# Patient Record
Sex: Male | Born: 1955 | Race: White | Hispanic: No | Marital: Single | Smoking: Never smoker
Health system: Southern US, Community
[De-identification: ages and names within clinical notes are randomized; demographics above are authoritative.]

## PROBLEM LIST (undated history)

## (undated) DIAGNOSIS — I10 Essential (primary) hypertension: Secondary | ICD-10-CM

## (undated) DIAGNOSIS — M1990 Unspecified osteoarthritis, unspecified site: Secondary | ICD-10-CM

## (undated) DIAGNOSIS — K760 Fatty (change of) liver, not elsewhere classified: Secondary | ICD-10-CM

## (undated) DIAGNOSIS — N4340 Spermatocele of epididymis, unspecified: Secondary | ICD-10-CM

## (undated) DIAGNOSIS — R600 Localized edema: Secondary | ICD-10-CM

## (undated) DIAGNOSIS — N433 Hydrocele, unspecified: Secondary | ICD-10-CM

## (undated) HISTORY — PX: COLONOSCOPY: SHX174

## (undated) HISTORY — PX: TONSILLECTOMY: SUR1361

---

## 2000-10-20 ENCOUNTER — Encounter: Payer: Self-pay | Admitting: Family Medicine

## 2000-10-20 ENCOUNTER — Encounter: Admission: RE | Admit: 2000-10-20 | Discharge: 2000-10-20 | Payer: Self-pay | Admitting: Family Medicine

## 2003-08-09 ENCOUNTER — Ambulatory Visit (HOSPITAL_COMMUNITY): Admission: RE | Admit: 2003-08-09 | Discharge: 2003-08-09 | Payer: Self-pay | Admitting: Gastroenterology

## 2004-06-01 HISTORY — PX: INGUINAL HERNIA REPAIR: SUR1180

## 2010-04-03 ENCOUNTER — Encounter: Admission: RE | Admit: 2010-04-03 | Discharge: 2010-04-03 | Payer: Self-pay | Admitting: Internal Medicine

## 2010-06-21 ENCOUNTER — Encounter: Payer: Self-pay | Admitting: Internal Medicine

## 2012-11-22 ENCOUNTER — Ambulatory Visit
Admission: RE | Admit: 2012-11-22 | Discharge: 2012-11-22 | Disposition: A | Discharge status: Home / Self Care | Payer: BC Managed Care – PPO | Source: Ambulatory Visit | Attending: Internal Medicine | Admitting: Internal Medicine

## 2012-11-22 ENCOUNTER — Other Ambulatory Visit: Payer: Self-pay | Admitting: Internal Medicine

## 2012-11-22 DIAGNOSIS — N5089 Other specified disorders of the male genital organs: Secondary | ICD-10-CM

## 2012-11-25 ENCOUNTER — Other Ambulatory Visit (HOSPITAL_COMMUNITY): Payer: Self-pay | Admitting: Orthopedic Surgery

## 2012-12-15 ENCOUNTER — Encounter (HOSPITAL_COMMUNITY): Payer: Self-pay

## 2012-12-20 ENCOUNTER — Encounter (HOSPITAL_COMMUNITY): Payer: Self-pay

## 2012-12-20 ENCOUNTER — Ambulatory Visit (HOSPITAL_COMMUNITY)
Admission: RE | Admit: 2012-12-20 | Discharge: 2012-12-20 | Disposition: A | Discharge status: Home / Self Care | Payer: BC Managed Care – PPO | Source: Ambulatory Visit | Attending: Orthopedic Surgery | Admitting: Orthopedic Surgery

## 2012-12-20 ENCOUNTER — Encounter (HOSPITAL_COMMUNITY)
Admission: RE | Admit: 2012-12-20 | Discharge: 2012-12-20 | Disposition: A | Discharge status: Home / Self Care | Payer: BC Managed Care – PPO | Source: Ambulatory Visit | Attending: Orthopedic Surgery | Admitting: Orthopedic Surgery

## 2012-12-20 DIAGNOSIS — Z01812 Encounter for preprocedural laboratory examination: Secondary | ICD-10-CM | POA: Insufficient documentation

## 2012-12-20 DIAGNOSIS — I10 Essential (primary) hypertension: Secondary | ICD-10-CM | POA: Insufficient documentation

## 2012-12-20 DIAGNOSIS — Z01818 Encounter for other preprocedural examination: Secondary | ICD-10-CM | POA: Insufficient documentation

## 2012-12-20 DIAGNOSIS — Z0183 Encounter for blood typing: Secondary | ICD-10-CM | POA: Insufficient documentation

## 2012-12-20 HISTORY — DX: Essential (primary) hypertension: I10

## 2012-12-20 LAB — COMPREHENSIVE METABOLIC PANEL
Albumin: 4.3 g/dL (ref 3.5–5.2)
Alkaline Phosphatase: 77 U/L (ref 39–117)
BUN: 16 mg/dL (ref 6–23)
Calcium: 10 mg/dL (ref 8.4–10.5)
Sodium: 137 mEq/L (ref 135–145)
Total Protein: 7.5 g/dL (ref 6.0–8.3)

## 2012-12-20 LAB — CBC
Hemoglobin: 15.1 g/dL (ref 13.0–17.0)
MCH: 34.2 pg — ABNORMAL HIGH (ref 26.0–34.0)
MCV: 95 fL (ref 78.0–100.0)
Platelets: 150 10*3/uL (ref 150–400)
RBC: 4.41 MIL/uL (ref 4.22–5.81)
WBC: 6.8 10*3/uL (ref 4.0–10.5)

## 2012-12-20 LAB — PROTIME-INR
INR: 0.96 (ref 0.00–1.49)
Prothrombin Time: 12.6 seconds (ref 11.6–15.2)

## 2012-12-20 LAB — SURGICAL PCR SCREEN: MRSA, PCR: NEGATIVE

## 2012-12-20 LAB — TYPE AND SCREEN
ABO/RH(D): O NEG
Antibody Screen: NEGATIVE

## 2012-12-20 NOTE — Pre-Procedure Instructions (Signed)
PLUMER MITTELSTAEDT  12/20/2012   Your procedure is scheduled on:  December 28, 2012  Report to [facility] at 6:30 AM.  Call this number if you have problems the morning of surgery: [phone]   Remember:   Do not eat food or drink liquids after midnight.   Take these medicines the morning of surgery with A SIP OF WATER: amLODipine (NORVASC) 10 MG tablet, carvedilol (COREG) 6.25 MG tablet   Do not wear jewelry  Do not wear lotions, powders, or colognes. You may wear deodorant.  Men may shave face and neck.  Do not bring valuables to the hospital.  [facility] is not responsible for any belongings or valuables.  Contacts, dentures or bridgework may not be worn into surgery.  Leave suitcase in the car. After surgery it may be brought to your room.  For patients admitted to the hospital, checkout time is 11:00 AM the day of discharge.   Special Instructions: Shower using CHG 2 nights before surgery and the night before surgery.  If you shower the day of surgery use CHG.  Use special wash - you have one bottle of CHG for all showers.  You should use approximately 1/3 of the bottle for each shower.   Please read over the following fact sheets that you were given: Pain Booklet, Coughing and Deep Breathing, Blood Transfusion Information and Surgical Site Infection Prevention

## 2012-12-21 NOTE — Progress Notes (Signed)
Anesthesia Chart Review:  Patient is a 57 year old male scheduled for left THA on 12/28/12 by Dr. Lajoyce Corners.  History includes HTN, non-smoker, hernia repair.  EKG on 12/20/12 showed SR with sinus arrythmia.  Motion in lead V2.  No CV symptoms were documented at his PAT visit.  Preoperative CXR and labs noted.  He will be evaluated by his assigned anesthesiologist on the day of surgery, but would anticipate that he could proceed as planned.  [facility]/Anesthesiology Phone [phone] 12/21/2012 2:17 PM

## 2012-12-27 MED ORDER — CEFAZOLIN SODIUM-DEXTROSE 2-3 GM-% IV SOLR
2.0000 g | INTRAVENOUS | Status: AC
  Administered 2012-12-28: 2 g via INTRAVENOUS
  Filled 2012-12-27: qty 50

## 2012-12-28 ENCOUNTER — Encounter (HOSPITAL_COMMUNITY): Payer: Self-pay | Admitting: Vascular Surgery

## 2012-12-28 ENCOUNTER — Ambulatory Visit (HOSPITAL_COMMUNITY): Payer: BC Managed Care – PPO | Admitting: Anesthesiology

## 2012-12-28 ENCOUNTER — Encounter (HOSPITAL_COMMUNITY)
Admission: RE | Disposition: A | Discharge status: Home Health | Payer: Self-pay | Source: Ambulatory Visit | Attending: Orthopedic Surgery

## 2012-12-28 ENCOUNTER — Inpatient Hospital Stay (HOSPITAL_COMMUNITY)
Admission: RE | Admit: 2012-12-28 | Discharge: 2012-12-30 | DRG: 818 | Disposition: A | Discharge status: Home Health | Payer: BC Managed Care – PPO | Source: Ambulatory Visit | Attending: Orthopedic Surgery | Admitting: Orthopedic Surgery

## 2012-12-28 ENCOUNTER — Inpatient Hospital Stay (HOSPITAL_COMMUNITY): Payer: BC Managed Care – PPO

## 2012-12-28 ENCOUNTER — Encounter (HOSPITAL_COMMUNITY): Payer: Self-pay | Admitting: *Deleted

## 2012-12-28 DIAGNOSIS — Z79899 Other long term (current) drug therapy: Secondary | ICD-10-CM

## 2012-12-28 DIAGNOSIS — M87059 Idiopathic aseptic necrosis of unspecified femur: Principal | ICD-10-CM | POA: Diagnosis present

## 2012-12-28 DIAGNOSIS — I10 Essential (primary) hypertension: Secondary | ICD-10-CM | POA: Diagnosis present

## 2012-12-28 DIAGNOSIS — M87052 Idiopathic aseptic necrosis of left femur: Secondary | ICD-10-CM

## 2012-12-28 HISTORY — PX: TOTAL HIP ARTHROPLASTY: SHX124

## 2012-12-28 HISTORY — DX: Unspecified osteoarthritis, unspecified site: M19.90

## 2012-12-28 SURGERY — ARTHROPLASTY, HIP, TOTAL,POSTERIOR APPROACH
Anesthesia: General | Site: Hip | Laterality: Left | Wound class: Clean

## 2012-12-28 MED ORDER — PROMETHAZINE HCL 25 MG/ML IJ SOLN: 6.2500 mg | INTRAMUSCULAR | Status: DC | PRN

## 2012-12-28 MED ORDER — OXYCODONE HCL 5 MG PO TABS
ORAL_TABLET | ORAL | Status: AC
  Filled 2012-12-28: qty 1

## 2012-12-28 MED ORDER — METHOCARBAMOL 100 MG/ML IJ SOLN
500.0000 mg | Freq: Four times a day (QID) | INTRAVENOUS | Status: DC | PRN
  Filled 2012-12-28: qty 5

## 2012-12-28 MED ORDER — ACETAMINOPHEN 325 MG PO TABS: 650.0000 mg | ORAL_TABLET | Freq: Four times a day (QID) | ORAL | Status: DC | PRN

## 2012-12-28 MED ORDER — OXYCODONE HCL 5 MG PO TABS
5.0000 mg | ORAL_TABLET | Freq: Once | ORAL | Status: AC | PRN
  Administered 2012-12-28: 5 mg via ORAL

## 2012-12-28 MED ORDER — METHOCARBAMOL 500 MG PO TABS
500.0000 mg | ORAL_TABLET | Freq: Four times a day (QID) | ORAL | Status: DC | PRN
  Administered 2012-12-29: 500 mg via ORAL
  Filled 2012-12-28 (×2): qty 1

## 2012-12-28 MED ORDER — LISINOPRIL 20 MG PO TABS
20.0000 mg | ORAL_TABLET | Freq: Every day | ORAL | Status: DC
  Administered 2012-12-28 – 2012-12-30 (×3): 20 mg via ORAL
  Filled 2012-12-28 (×3): qty 1

## 2012-12-28 MED ORDER — PROPOFOL 10 MG/ML IV BOLUS
INTRAVENOUS | Status: DC | PRN
  Administered 2012-12-28: 130 mg via INTRAVENOUS

## 2012-12-28 MED ORDER — METOCLOPRAMIDE HCL 5 MG/ML IJ SOLN
5.0000 mg | Freq: Three times a day (TID) | INTRAMUSCULAR | Status: DC | PRN
  Filled 2012-12-28: qty 2

## 2012-12-28 MED ORDER — OXYCODONE HCL 5 MG/5ML PO SOLN: 5.0000 mg | Freq: Once | ORAL | Status: AC | PRN

## 2012-12-28 MED ORDER — GLYCOPYRROLATE 0.2 MG/ML IJ SOLN
INTRAMUSCULAR | Status: DC | PRN
  Administered 2012-12-28: 0.4 mg via INTRAVENOUS

## 2012-12-28 MED ORDER — LACTATED RINGERS IV SOLN
INTRAVENOUS | Status: DC | PRN
  Administered 2012-12-28 (×2): via INTRAVENOUS

## 2012-12-28 MED ORDER — MENTHOL 3 MG MT LOZG: 1.0000 | LOZENGE | OROMUCOSAL | Status: DC | PRN

## 2012-12-28 MED ORDER — FENTANYL CITRATE 0.05 MG/ML IJ SOLN
INTRAMUSCULAR | Status: DC | PRN
  Administered 2012-12-28: 50 ug via INTRAVENOUS
  Administered 2012-12-28: 100 ug via INTRAVENOUS
  Administered 2012-12-28 (×2): 50 ug via INTRAVENOUS
  Administered 2012-12-28: 150 ug via INTRAVENOUS

## 2012-12-28 MED ORDER — DOCUSATE SODIUM 100 MG PO CAPS
100.0000 mg | ORAL_CAPSULE | Freq: Two times a day (BID) | ORAL | Status: DC
  Administered 2012-12-28 – 2012-12-30 (×4): 100 mg via ORAL
  Filled 2012-12-28 (×6): qty 1

## 2012-12-28 MED ORDER — MIDAZOLAM HCL 2 MG/2ML IJ SOLN: 0.5000 mg | Freq: Once | INTRAMUSCULAR | Status: DC | PRN

## 2012-12-28 MED ORDER — NEOSTIGMINE METHYLSULFATE 1 MG/ML IJ SOLN
INTRAMUSCULAR | Status: DC | PRN
  Administered 2012-12-28: 3 mg via INTRAVENOUS

## 2012-12-28 MED ORDER — SODIUM CHLORIDE 0.9 % IV SOLN
INTRAVENOUS | Status: DC
  Administered 2012-12-28: 19:00:00 via INTRAVENOUS

## 2012-12-28 MED ORDER — BISACODYL 5 MG PO TBEC: 5.0000 mg | DELAYED_RELEASE_TABLET | Freq: Every day | ORAL | Status: DC | PRN

## 2012-12-28 MED ORDER — DIPHENHYDRAMINE HCL 12.5 MG/5ML PO ELIX: 12.5000 mg | ORAL_SOLUTION | ORAL | Status: DC | PRN

## 2012-12-28 MED ORDER — FERROUS SULFATE 325 (65 FE) MG PO TABS
325.0000 mg | ORAL_TABLET | Freq: Three times a day (TID) | ORAL | Status: DC
  Administered 2012-12-29 – 2012-12-30 (×4): 325 mg via ORAL
  Filled 2012-12-28 (×8): qty 1

## 2012-12-28 MED ORDER — ROCURONIUM BROMIDE 100 MG/10ML IV SOLN
INTRAVENOUS | Status: DC | PRN
  Administered 2012-12-28: 50 mg via INTRAVENOUS

## 2012-12-28 MED ORDER — HYDROMORPHONE HCL PF 1 MG/ML IJ SOLN
INTRAMUSCULAR | Status: AC
  Filled 2012-12-28: qty 1

## 2012-12-28 MED ORDER — MAGNESIUM CITRATE PO SOLN
1.0000 | Freq: Once | ORAL | Status: AC | PRN
  Filled 2012-12-28: qty 296

## 2012-12-28 MED ORDER — LIDOCAINE HCL (CARDIAC) 20 MG/ML IV SOLN
INTRAVENOUS | Status: DC | PRN
  Administered 2012-12-28: 30 mg via INTRAVENOUS

## 2012-12-28 MED ORDER — AMLODIPINE BESYLATE 10 MG PO TABS
10.0000 mg | ORAL_TABLET | Freq: Every day | ORAL | Status: DC
  Administered 2012-12-29 – 2012-12-30 (×2): 10 mg via ORAL
  Filled 2012-12-28 (×3): qty 1

## 2012-12-28 MED ORDER — ONDANSETRON HCL 4 MG PO TABS
4.0000 mg | ORAL_TABLET | Freq: Four times a day (QID) | ORAL | Status: DC | PRN
  Filled 2012-12-28: qty 1

## 2012-12-28 MED ORDER — SENNOSIDES-DOCUSATE SODIUM 8.6-50 MG PO TABS: 1.0000 | ORAL_TABLET | Freq: Every evening | ORAL | Status: DC | PRN

## 2012-12-28 MED ORDER — DEXAMETHASONE SODIUM PHOSPHATE 4 MG/ML IJ SOLN
INTRAMUSCULAR | Status: DC | PRN
  Administered 2012-12-28: 4 mg via INTRAVENOUS

## 2012-12-28 MED ORDER — CARVEDILOL 6.25 MG PO TABS
6.2500 mg | ORAL_TABLET | Freq: Two times a day (BID) | ORAL | Status: DC
  Administered 2012-12-29 – 2012-12-30 (×3): 6.25 mg via ORAL
  Filled 2012-12-28 (×6): qty 1

## 2012-12-28 MED ORDER — MEPERIDINE HCL 25 MG/ML IJ SOLN: 6.2500 mg | INTRAMUSCULAR | Status: DC | PRN

## 2012-12-28 MED ORDER — MIDAZOLAM HCL 5 MG/5ML IJ SOLN
INTRAMUSCULAR | Status: DC | PRN
  Administered 2012-12-28: 2 mg via INTRAVENOUS

## 2012-12-28 MED ORDER — ASPIRIN EC 325 MG PO TBEC
325.0000 mg | DELAYED_RELEASE_TABLET | Freq: Every day | ORAL | Status: DC
  Administered 2012-12-29 – 2012-12-30 (×2): 325 mg via ORAL
  Filled 2012-12-28 (×3): qty 1

## 2012-12-28 MED ORDER — PHENOL 1.4 % MT LIQD: 1.0000 | OROMUCOSAL | Status: DC | PRN

## 2012-12-28 MED ORDER — ACETAMINOPHEN 650 MG RE SUPP: 650.0000 mg | Freq: Four times a day (QID) | RECTAL | Status: DC | PRN

## 2012-12-28 MED ORDER — CEFAZOLIN SODIUM 1-5 GM-% IV SOLN
1.0000 g | Freq: Four times a day (QID) | INTRAVENOUS | Status: AC
  Administered 2012-12-28 – 2012-12-29 (×2): 1 g via INTRAVENOUS
  Filled 2012-12-28 (×2): qty 50

## 2012-12-28 MED ORDER — LIDOCAINE HCL 4 % MT SOLN
OROMUCOSAL | Status: DC | PRN
  Administered 2012-12-28: 4 mL via TOPICAL

## 2012-12-28 MED ORDER — ALUM & MAG HYDROXIDE-SIMETH 200-200-20 MG/5ML PO SUSP: 30.0000 mL | ORAL | Status: DC | PRN

## 2012-12-28 MED ORDER — HYDROMORPHONE HCL PF 1 MG/ML IJ SOLN
0.2500 mg | INTRAMUSCULAR | Status: DC | PRN
  Administered 2012-12-28 (×3): 0.5 mg via INTRAVENOUS

## 2012-12-28 MED ORDER — ONDANSETRON HCL 4 MG/2ML IJ SOLN
4.0000 mg | Freq: Four times a day (QID) | INTRAMUSCULAR | Status: DC | PRN
  Filled 2012-12-28: qty 2

## 2012-12-28 MED ORDER — SODIUM CHLORIDE 0.9 % IR SOLN
Status: DC | PRN
  Administered 2012-12-28 (×2): 1000 mL

## 2012-12-28 MED ORDER — ONDANSETRON HCL 4 MG/2ML IJ SOLN
INTRAMUSCULAR | Status: DC | PRN
  Administered 2012-12-28: 4 mg via INTRAVENOUS

## 2012-12-28 MED ORDER — KETOROLAC TROMETHAMINE 15 MG/ML IJ SOLN
15.0000 mg | Freq: Four times a day (QID) | INTRAMUSCULAR | Status: AC
  Administered 2012-12-28 – 2012-12-29 (×4): 15 mg via INTRAVENOUS
  Filled 2012-12-28 (×4): qty 1

## 2012-12-28 MED ORDER — METOCLOPRAMIDE HCL 5 MG PO TABS
5.0000 mg | ORAL_TABLET | Freq: Three times a day (TID) | ORAL | Status: DC | PRN
  Filled 2012-12-28: qty 2

## 2012-12-28 MED ORDER — OXYCODONE HCL 5 MG PO TABS
5.0000 mg | ORAL_TABLET | ORAL | Status: DC | PRN
  Administered 2012-12-28 – 2012-12-29 (×2): 10 mg via ORAL
  Administered 2012-12-30 (×2): 5 mg via ORAL
  Filled 2012-12-28 (×2): qty 2
  Filled 2012-12-28 (×2): qty 1

## 2012-12-28 SURGICAL SUPPLY — 52 items
BLADE SAW SAG 73X25 THK (BLADE) ×1
BLADE SAW SGTL 73X25 THK (BLADE) ×1 IMPLANT
BLADE SURG 10 STRL SS (BLADE) IMPLANT
BLADE SURG 21 STRL SS (BLADE) ×2 IMPLANT
BRUSH FEMORAL CANAL (MISCELLANEOUS) IMPLANT
CAP POR ST-NK/TM CUP XLPE LIN ×1 IMPLANT
CLOTH BEACON ORANGE TIMEOUT ST (SAFETY) ×2 IMPLANT
COVER BACK TABLE 24X17X13 BIG (DRAPES) IMPLANT
COVER SURGICAL LIGHT HANDLE (MISCELLANEOUS) ×2 IMPLANT
DRAPE INCISE IOBAN 85X60 (DRAPES) ×3 IMPLANT
DRAPE ORTHO SPLIT 77X108 STRL (DRAPES) ×4
DRAPE SURG ORHT 6 SPLT 77X108 (DRAPES) ×2 IMPLANT
DRAPE U-SHAPE 47X51 STRL (DRAPES) ×2 IMPLANT
DRSG MEPILEX BORDER 4X12 (GAUZE/BANDAGES/DRESSINGS) ×1 IMPLANT
DRSG MEPILEX BORDER 4X8 (GAUZE/BANDAGES/DRESSINGS) ×1 IMPLANT
DURAPREP 26ML APPLICATOR (WOUND CARE) ×2 IMPLANT
ELECT BLADE 6.5 EXT (BLADE) ×1 IMPLANT
ELECT CAUTERY BLADE 6.4 (BLADE) ×2 IMPLANT
ELECT REM PT RETURN 9FT ADLT (ELECTROSURGICAL) ×2
ELECTRODE REM PT RTRN 9FT ADLT (ELECTROSURGICAL) ×1 IMPLANT
GLOVE BIO SURGEON STRL SZ7 (GLOVE) ×1 IMPLANT
GLOVE BIO SURGEON STRL SZ7.5 (GLOVE) ×1 IMPLANT
GLOVE BIOGEL PI IND STRL 7.0 (GLOVE) IMPLANT
GLOVE BIOGEL PI IND STRL 9 (GLOVE) ×1 IMPLANT
GLOVE BIOGEL PI INDICATOR 7.0 (GLOVE) ×1
GLOVE BIOGEL PI INDICATOR 9 (GLOVE) ×1
GLOVE EUDERMIC 7 POWDERFREE (GLOVE) ×1 IMPLANT
GLOVE SURG ORTHO 9.0 STRL STRW (GLOVE) ×2 IMPLANT
GOWN PREVENTION PLUS XLARGE (GOWN DISPOSABLE) ×1 IMPLANT
GOWN SRG XL XLNG 56XLVL 4 (GOWN DISPOSABLE) ×1 IMPLANT
GOWN STRL NON-REIN XL XLG LVL4 (GOWN DISPOSABLE)
HANDPIECE INTERPULSE COAX TIP (DISPOSABLE)
KIT BASIN OR (CUSTOM PROCEDURE TRAY) ×2 IMPLANT
KIT ROOM TURNOVER OR (KITS) ×2 IMPLANT
MANIFOLD NEPTUNE II (INSTRUMENTS) ×2 IMPLANT
NS IRRIG 1000ML POUR BTL (IV SOLUTION) ×2 IMPLANT
PACK TOTAL JOINT (CUSTOM PROCEDURE TRAY) ×2 IMPLANT
PAD ARMBOARD 7.5X6 YLW CONV (MISCELLANEOUS) ×4 IMPLANT
PRESSURIZER FEMORAL UNIV (MISCELLANEOUS) IMPLANT
SET HNDPC FAN SPRY TIP SCT (DISPOSABLE) IMPLANT
STAPLER VISISTAT 35W (STAPLE) ×2 IMPLANT
SUT ETHIBOND NAB CT1 #1 30IN (SUTURE) ×2 IMPLANT
SUT VIC AB 0 CT1 27 (SUTURE) ×4
SUT VIC AB 0 CT1 27XBRD ANBCTR (SUTURE) ×2 IMPLANT
SUT VIC AB 1 CTX 36 (SUTURE) ×2
SUT VIC AB 1 CTX36XBRD ANBCTR (SUTURE) ×1 IMPLANT
SUT VIC AB 2-0 CTB1 (SUTURE) ×4 IMPLANT
TOWEL OR 17X24 6PK STRL BLUE (TOWEL DISPOSABLE) ×2 IMPLANT
TOWEL OR 17X26 10 PK STRL BLUE (TOWEL DISPOSABLE) ×2 IMPLANT
TOWER CARTRIDGE SMART MIX (DISPOSABLE) IMPLANT
TRAY FOLEY CATH 16FRSI W/METER (SET/KITS/TRAYS/PACK) IMPLANT
WATER STERILE IRR 1000ML POUR (IV SOLUTION) ×6 IMPLANT

## 2012-12-28 NOTE — Plan of Care (Signed)
Problem: Consults Goal: Diagnosis- Total Joint Replacement Primary Total Hip Left     

## 2012-12-28 NOTE — Anesthesia Postprocedure Evaluation (Signed)
  Anesthesia Post-op Note  Patient: James Whitehead  Procedure(s) Performed: Procedure(s) with comments: TOTAL HIP ARTHROPLASTY- left (Left) - Left Total Hip Arthroplasty  Patient Location: PACU  Anesthesia Type:General  Level of Consciousness: awake, alert , oriented and patient cooperative  Airway and Oxygen Therapy: Patient Spontanous Breathing and Patient connected to nasal cannula oxygen  Post-op Pain: mild  Post-op Assessment: Post-op Vital signs reviewed, Patient's Cardiovascular Status Stable, Respiratory Function Stable, Patent Airway, No signs of Nausea or vomiting and Pain level controlled  Post-op Vital Signs: Reviewed and stable  Complications: No apparent anesthesia complications

## 2012-12-28 NOTE — Preoperative (Signed)
Beta Blockers   Reason not to administer Beta Blockers:coreg 12/28/12 0500

## 2012-12-28 NOTE — Anesthesia Procedure Notes (Signed)
Procedure Name: Intubation Date/Time: 12/28/2012 8:40 AM Performed by: Leona Singleton A Pre-anesthesia Checklist: Patient identified, Emergency Drugs available, Suction available and Patient being monitored Patient Re-evaluated:Patient Re-evaluated prior to inductionOxygen Delivery Method: Circle system utilized Preoxygenation: Pre-oxygenation with 100% oxygen Intubation Type: IV induction Ventilation: Mask ventilation without difficulty and Oral airway inserted - appropriate to patient size Laryngoscope Size: Hyacinth Meeker and 2 Grade View: Grade I Tube type: Oral Tube size: 7.5 mm Number of attempts: 1 Airway Equipment and Method: Stylet and LTA kit utilized Placement Confirmation: ETT inserted through vocal cords under direct vision,  positive ETCO2 and breath sounds checked- equal and bilateral Secured at: 23 cm Tube secured with: Tape Dental Injury: Teeth and Oropharynx as per pre-operative assessment

## 2012-12-28 NOTE — Evaluation (Signed)
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  Pt will benefit from skilled PT to increase their independence and safety with mobility to allow discharge to the venue listed below.      PT Assessment  Patient needs continued PT services    Follow Up Recommendations  Home health PT;Supervision/Assistance - 24 hour    Does the patient have the potential to tolerate intense rehabilitation      Barriers to Discharge   uncles lives next door,strongly suggested uncle to stay with pt for the first few days    Equipment Recommendations  Rolling walker with 5" wheels;3in1 (PT)    Recommendations for Other Services OT consult   Frequency 7X/week    Precautions / Restrictions Precautions Precautions: Posterior Hip Precaution Booklet Issued: Yes (comment) Precaution Comments: pt able to recall 2/3 at end of tx Restrictions Weight Bearing Restrictions: Yes LLE Weight Bearing: Weight bearing as tolerated   Pertinent Vitals/Pain 2/10 L hip pain      Mobility  Bed Mobility Bed Mobility: Supine to Sit Supine to Sit: 4: Min assist;HOB flat Details for Bed Mobility Assistance: v/c's for long sit technique, minA for L LE Transfers Transfers: Sit to Stand;Stand to Sit Sit to Stand: 4: Min guard;With upper extremity assist;With armrests Stand to Sit: 4: Min guard;With upper extremity assist;To chair/3-in-1 Details for Transfer Assistance: v/c's for hand placement and L LE mangement Ambulation/Gait Ambulation/Gait Assistance: 4: Min guard Ambulation Distance (Feet): 20 Feet Assistive device: Rolling walker Ambulation/Gait Assistance Details: v/c's for  sequencing Gait Pattern: Step-to pattern;Antalgic Gait velocity: slow Stairs: No    Exercises     PT Diagnosis: Difficulty walking;Acute pain  PT Problem List: Decreased strength;Decreased range of motion;Decreased activity tolerance;Decreased mobility PT Treatment Interventions: DME instruction;Gait training;Stair training;Functional mobility training;Therapeutic activities;Therapeutic exercise     PT Goals(Current goals can be found in the care plan section) Acute Rehab PT Goals Patient Stated Goal: home PT Goal Formulation: With patient Time For Goal Achievement: 01/04/13 Potential to Achieve Goals: Good  Visit Information  Last PT Received On: 12/28/12 Assistance Needed: +1 History of Present Illness: Pt admitted for L THA secondary to AVN       Prior Functioning  Home Living Family/patient expects to be discharged to:: Private residence Living Arrangements: Other relatives (uncle) Available Help at Discharge: Family;Available 24 hours/day Type of Home: House Home Access: Stairs to enter [employer] of Steps: 2 Entrance Stairs-Rails: None Home Layout: One level Home Equipment: None Prior Function Level of Independence: Independent (works full time as Personnel officer) Musician: No difficulties Dominant Hand: Right    Cognition  Cognition Arousal/Alertness: Awake/alert Behavior During Therapy: WFL for tasks assessed/performed Overall Cognitive Status: Within Functional Limits for tasks assessed    Extremity/Trunk Assessment Upper Extremity Assessment Upper Extremity Assessment: Overall WFL for tasks assessed Lower Extremity Assessment Lower Extremity Assessment: LLE deficits/detail LLE Deficits / Details: MMT not completed due to recent surgery, knee/ankle WFL, 45 deg active L hip flex Cervical / Trunk Assessment Cervical / Trunk Assessment: Normal   Balance    End of Session PT - End of Session Equipment Utilized During Treatment:  Gait belt Activity Tolerance: Patient tolerated treatment well Patient  left: in chair;with call bell/phone within reach Nurse Communication: Mobility status (pt c/o nausea - provided saltines)  GP     Marcene Brawn 12/28/2012, 4:50 PM  Lewis Shock, PT, DPT Pager #: [phone] Office #: [phone]

## 2012-12-28 NOTE — Progress Notes (Signed)
Orthopedic Tech Progress Note Patient Details:  James Whitehead 1955/10/06 [id number] Applied overhead frame to bed.     Jennye Moccasin 12/28/2012, 6:51 PM

## 2012-12-28 NOTE — Op Note (Signed)
OPERATIVE REPORT  DATE OF SURGERY: 12/28/2012  PATIENT:  James Whitehead,  57 y.o. male  PRE-OPERATIVE DIAGNOSIS:  AVN Left Hip  POST-OPERATIVE DIAGNOSIS:  AVN Left Hip  PROCEDURE:  Procedure(s): TOTAL HIP ARTHROPLASTY- left Zimmer components. 52 mm acetabulum. 0 polyethylene liner. 36 mm ceramic ball. +4 offset +4 length with anteverted femoral neck. Size 6 femur.  SURGEON:  Surgeon(s): Nadara Mustard, MD  ANESTHESIA:   general  EBL:  Minimal ML  SPECIMEN:  No Specimen  TOURNIQUET:  * No tourniquets in log *  PROCEDURE DETAILS: Patient is a 58 year old gentleman with avascular necrosis of the left hip. He is complete destruction of the hip joint with bone-on-bone contact asymmetric femoral head with superior migration. Due to failure conservative care pain with activities daily living patient presents at this time for surgical intervention. Risks and benefits were discussed including infection neurovascular injury DVT dislocation and need for additional surgery. Patient states he understands was to proceed at this time. Description of procedure patient was brought to the operating room and underwent a general anesthetic. After adequate levels of anesthesia were obtained patient was placed in the right lateral decubitus position with the left side up and the left lower extremity was prepped using DuraPrep draped into a sterile field an [place] was used to cover all exposed skin. A posterior lateral incision was made this carried down to the tensor fascia lata which was split. The tear for the short external rotators and capsule were tagged and reflected off the femoral neck. The hip was dislocated the femoral neck cut was made 1 cm proximal to the calcar. Attention was first focused on the acetabulum. Acetabulum was reamed to 52 mm. A 52 mm acetabular was placed at 45 of abduction and 20 of anteversion. This was secured with one 30 mm screw. The 0 polyethylene liner was placed. Attention  was then focused on the femur femur was sequentially broached to a size 6 a size 6 final implant was placed and this was trialed with several trial necks and the +4 length +4 offset and anteverted neck had good stability with full flexion to 120 full abduction internal rotation of 70 and the hip was stable. The trial components were removed final ceramic 36 mm femoral head was inserted with the femoral neck. Again the hip was placed the range of motion the hip was stable. The capsule short external rotators and piriformis were reapproximated with the Ethibond suture. The tensor fascia lata was closed using #1 Vicryl. Subcutaneous is closed using 0 Vicryl. The skin was closed using staples. A Mepilex dressing was applied. Patient was extubated taken to the PACU in stable condition.  PLAN OF CARE: Admit to inpatient   PATIENT DISPOSITION:  PACU - hemodynamically stable.   Nadara Mustard, MD 12/28/2012 9:36 AM

## 2012-12-28 NOTE — H&P (Signed)
TOTAL HIP ADMISSION H&P  Patient is admitted for left total hip arthroplasty.  Subjective:  Chief Complaint: left hip pain  HPI: James Whitehead, 57 y.o. male, has a history of pain and functional disability in the left hip(s) due to arthritis and patient has failed non-surgical conservative treatments for greater than 12 weeks to include NSAID's and/or analgesics, use of assistive devices and activity modification.  Onset of symptoms was gradual starting 8 years ago with gradually worsening course since that time.The patient noted no past surgery on the left hip(s).  Patient currently rates pain in the left hip at 8 out of 10 with activity. Patient has night pain, worsening of pain with activity and weight bearing, trendelenberg gait, pain that interfers with activities of daily living and pain with passive range of motion. Patient has evidence of subchondral cysts, subchondral sclerosis, periarticular osteophytes and joint space narrowing by imaging studies. This condition presents safety issues increasing the risk of falls. This patient has had Failed conservative nonoperative treatment.  There is no current active infection.  There are no active problems to display for this patient.  Past Medical History  Diagnosis Date  . Hypertension     Past Surgical History  Procedure Laterality Date  . Hernia repair      Prescriptions prior to admission  Medication Sig Dispense Refill  . amLODipine (NORVASC) 10 MG tablet Take 10 mg by mouth daily.      Marland Kitchen ascorbic acid (VITAMIN C) 1000 MG tablet Take 2,000 mg by mouth daily.      . carvedilol (COREG) 6.25 MG tablet Take 6.25 mg by mouth 2 (two) times daily with a meal.      . Cholecalciferol (VITAMIN D) 2000 UNITS CAPS Take 2 capsules by mouth daily.      . Cyanocobalamin (VITAMIN B-12 PO) Take 1 tablet by mouth daily.      . fish oil-omega-3 fatty acids 1000 MG capsule Take 4 g by mouth daily.      Marland Kitchen glucosamine-chondroitin 500-400 MG tablet Take 2  tablets by mouth daily.      Marland Kitchen lisinopril (PRINIVIL,ZESTRIL) 20 MG tablet Take 20 mg by mouth daily.       No Known Allergies  History  Substance Use Topics  . Smoking status: Never Smoker   . Smokeless tobacco: Not on file  . Alcohol Use: 4.2 oz/week    7 Cans of beer per week    No family history on file.   Review of Systems  All other systems reviewed and are negative.    Objective:  Physical Exam  Vital signs in last 24 hours:    Labs:   There is no height or weight on file to calculate BMI.   Imaging Review Plain radiographs demonstrate moderate degenerative joint disease of the left hip(s). The bone quality appears to be adequate for age and reported activity level.  Assessment/Plan:  End stage arthritis, left hip(s)  The patient history, physical examination, clinical judgement of the provider and imaging studies are consistent with end stage degenerative joint disease of the left hip(s) and total hip arthroplasty is deemed medically necessary. The treatment options including medical management, injection therapy, arthroscopy and arthroplasty were discussed at length. The risks and benefits of total hip arthroplasty were presented and reviewed. The risks due to aseptic loosening, infection, stiffness, dislocation/subluxation,  thromboembolic complications and other imponderables were discussed.  The patient acknowledged the explanation, agreed to proceed with the plan and consent was signed. Patient  is being admitted for inpatient treatment for surgery, pain control, PT, OT, prophylactic antibiotics, VTE prophylaxis, progressive ambulation and ADL's and discharge planning.The patient is planning to be discharged home with home health services

## 2012-12-28 NOTE — Anesthesia Preprocedure Evaluation (Addendum)
Anesthesia Evaluation  Patient identified by MRN, date of birth, ID band Patient awake    Reviewed: Allergy & Precautions, H&P , NPO status , Patient's Chart, lab work & pertinent test results, reviewed documented beta blocker date and time   History of Anesthesia Complications Negative for: history of anesthetic complications  Airway Mallampati: I TM Distance: >3 FB Neck ROM: Full    Dental  (+) Teeth Intact, Chipped, Dental Advisory Given and Caps,    Pulmonary neg pulmonary ROS,  breath sounds clear to auscultation  Pulmonary exam normal       Cardiovascular hypertension, Pt. on medications and Pt. on home beta blockers Rhythm:Regular Rate:Normal     Neuro/Psych negative neurological ROS     GI/Hepatic negative GI ROS, (+)     substance abuse  alcohol use,   Endo/Other  negative endocrine ROS  Renal/GU negative Renal ROS  negative genitourinary   Musculoskeletal  (+) Arthritis -, Osteoarthritis,    Abdominal   Peds  Hematology negative hematology ROS (+)   Anesthesia Other Findings   Reproductive/Obstetrics                         Anesthesia Physical Anesthesia Plan  ASA: II  Anesthesia Plan: General   Post-op Pain Management:    Induction: Intravenous  Airway Management Planned: Oral ETT  Additional Equipment:   Intra-op Plan:   Post-operative Plan: Extubation in OR  Informed Consent: I have reviewed the patients History and Physical, chart, labs and discussed the procedure including the risks, benefits and alternatives for the proposed anesthesia with the patient or authorized representative who has indicated his/her understanding and acceptance.   Dental advisory given  Plan Discussed with: Surgeon and CRNA  Anesthesia Plan Comments: (Plan routine monitors, GETA)        Anesthesia Quick Evaluation

## 2012-12-28 NOTE — Transfer of Care (Signed)
Immediate Anesthesia Transfer of Care Note  Patient: James Whitehead  Procedure(s) Performed: Procedure(s) with comments: TOTAL HIP ARTHROPLASTY- left (Left) - Left Total Hip Arthroplasty  Patient Location: PACU  Anesthesia Type:General  Level of Consciousness: awake, alert , oriented and patient cooperative  Airway & Oxygen Therapy: Patient Spontanous Breathing and Patient connected to nasal cannula oxygen  Post-op Assessment: Report given to PACU RN, Post -op Vital signs reviewed and stable and Patient moving all extremities X 4  Post vital signs: Reviewed and stable  Complications: No apparent anesthesia complications

## 2012-12-29 DIAGNOSIS — M87052 Idiopathic aseptic necrosis of left femur: Secondary | ICD-10-CM

## 2012-12-29 LAB — BASIC METABOLIC PANEL
BUN: 17 mg/dL (ref 6–23)
Chloride: 100 mEq/L (ref 96–112)
Glucose, Bld: 128 mg/dL — ABNORMAL HIGH (ref 70–99)
Potassium: 4.6 mEq/L (ref 3.5–5.1)

## 2012-12-29 LAB — CBC
HCT: 36 % — ABNORMAL LOW (ref 39.0–52.0)
Hemoglobin: 12.6 g/dL — ABNORMAL LOW (ref 13.0–17.0)
MCHC: 35 g/dL (ref 30.0–36.0)

## 2012-12-29 LAB — GLUCOSE, CAPILLARY: Glucose-Capillary: 125 mg/dL — ABNORMAL HIGH (ref 70–99)

## 2012-12-29 NOTE — Progress Notes (Signed)
Patient ID: James Whitehead, male   DOB: [birth year], 57 y.o.   MRN: [id number] Postoperative day 1 left total hip arthroplasty for avascular necrosis. Radiograph shows stable alignment. Hemoglobin stable. Physical therapy progressive ambulation posterior total hip precautions.

## 2012-12-29 NOTE — Progress Notes (Signed)
Physical Therapy Treatment Patient Details Name: EMMERT ROETHLER MRN: [id number] DOB: July 20, 1955 Today's Date: 12/29/2012 Time: 4098-1191 PT Time Calculation (min): 24 min  PT Assessment / Plan / Recommendation  History of Present Illness Pt admitted for L THA secondary to AVN   PT Comments   Patient ambulating independently around nursing unit. Notified nurse that patient has met his mobility goals with PT and was safe to DC home if ok with MD. Nurse was going to page the MD at some point today  Follow Up Recommendations  Home health PT;Supervision/Assistance - 24 hour     Does the patient have the potential to tolerate intense rehabilitation     Barriers to Discharge        Equipment Recommendations  Rolling walker with 5" wheels;3in1 (PT)    Recommendations for Other Services    Frequency 7X/week   Progress towards PT Goals Progress towards PT goals: Progressing toward goals  Plan Current plan remains appropriate    Precautions / Restrictions Precautions Precautions: Posterior Hip Restrictions LLE Weight Bearing: Weight bearing as tolerated   Pertinent Vitals/Pain no apparent distress    Mobility  Transfers Sit to Stand: 6: Modified independent (Device/Increase time) Stand to Sit: 6: Modified independent (Device/Increase time) Ambulation/Gait Ambulation/Gait Assistance: 6: Modified independent (Device/Increase time) Ambulation Distance (Feet): 1200 Feet Assistive device: Straight cane Gait Pattern: Step-through pattern;Within Functional Limits Stairs: Yes Stairs Assistance: 5: Supervision Stair Management Technique: No rails;Forwards Number of Stairs: 5    Exercises     PT Diagnosis:    PT Problem List:   PT Treatment Interventions:     PT Goals (current goals can now be found in the care plan section)    Visit Information  Last PT Received On: 12/29/12 Assistance Needed: +1 History of Present Illness: Pt admitted for L THA secondary to AVN     Subjective Data      Cognition  Cognition Arousal/Alertness: Awake/alert Behavior During Therapy: WFL for tasks assessed/performed Overall Cognitive Status: Within Functional Limits for tasks assessed    Balance     End of Session PT - End of Session Equipment Utilized During Treatment: Gait belt Activity Tolerance: Patient tolerated treatment well Patient left: in chair;with call bell/phone within reach Nurse Communication: Mobility status   GP     Fredrich Birks 12/29/2012, 1:30 PM  12/29/2012 Fredrich Birks PTA [phone] pager [phone] office

## 2012-12-29 NOTE — Progress Notes (Signed)
UR COMPLETED  

## 2012-12-29 NOTE — Progress Notes (Signed)
Physical Therapy Treatment Patient Details Name: James Whitehead MRN: [id number] DOB: 01/20/1956 Today's Date: 12/29/2012 Time: 0800-0829 PT Time Calculation (min): 29 min  PT Assessment / Plan / Recommendation  History of Present Illness Pt admitted for L THA secondary to AVN   PT Comments   Patient progressing very well . WIll attempt steps next session. Anticipate DC home tomorrow based on progress towards goals.   Follow Up Recommendations  Home health PT;Supervision/Assistance - 24 hour     Does the patient have the potential to tolerate intense rehabilitation     Barriers to Discharge        Equipment Recommendations  Rolling walker with 5" wheels;3in1 (PT)    Recommendations for Other Services    Frequency 7X/week   Progress towards PT Goals Progress towards PT goals: Progressing toward goals  Plan Current plan remains appropriate    Precautions / Restrictions Precautions Precautions: Posterior Hip Precaution Comments: Patient able to recall all precautions Restrictions LLE Weight Bearing: Weight bearing as tolerated   Pertinent Vitals/Pain no apparent distress     Mobility  Bed Mobility Supine to Sit: 5: Supervision Transfers Sit to Stand: 5: Supervision Stand to Sit: 5: Supervision Details for Transfer Assistance: Cues for LLE positioning and hand placement Ambulation/Gait Ambulation/Gait Assistance: 4: Min guard Ambulation Distance (Feet): 200 Feet Assistive device: Rolling walker Ambulation/Gait Assistance Details: Cues for sequency    Exercises Total Joint Exercises Heel Slides: AROM;Left;10 reps Hip ABduction/ADduction: AROM;Left;10 reps Long Arc Quad: AROM;Left;10 reps   PT Diagnosis:    PT Problem List:   PT Treatment Interventions:     PT Goals (current goals can now be found in the care plan section)    Visit Information  Last PT Received On: 12/29/12 Assistance Needed: +1 History of Present Illness: Pt admitted for L THA secondary to  AVN    Subjective Data      Cognition  Cognition Arousal/Alertness: Awake/alert Behavior During Therapy: WFL for tasks assessed/performed Overall Cognitive Status: Within Functional Limits for tasks assessed    Balance     End of Session PT - End of Session Equipment Utilized During Treatment: Gait belt Activity Tolerance: Patient tolerated treatment well Patient left: in chair;with call bell/phone within reach Nurse Communication: Mobility status   GP     Fredrich Birks 12/29/2012, 8:40 AM  12/29/2012 Fredrich Birks PTA [phone] pager [phone] office

## 2012-12-29 NOTE — Progress Notes (Signed)
12/29/12 Spoke with patient about HHC, he chose [facility]. Contacted Karen at [place], set up HHPT and HHOT. Faxed demographics, HHC order, H and P, OT note, PT note and OT note to [phone]. Received confirmation. Contacted [facility] , 3N1 and rolling walker delivered to patient's room. Jacquelynn Cree RN, BSN, CCM

## 2012-12-29 NOTE — Evaluation (Signed)
Occupational Therapy Evaluation Patient Details Name: James Whitehead MRN: [id number] DOB: 08-13-1955 Today's Date: 12/29/2012 Time: 0831-0900 OT Time Calculation (min): 29 min  OT Assessment / Plan / Recommendation History of present illness Pt admitted for L THA secondary to AVN   Clinical Impression   Pt s/p L THA thus affecting PLOF. Will benefit from continued acute OT services to address below problem list.  Recommending HHOT and 24/7 supervision/assist upon return home.    OT Assessment  Patient needs continued OT Services    Follow Up Recommendations  Home health OT;Supervision/Assistance - 24 hour    Barriers to Discharge      Equipment Recommendations  3 in 1 bedside comode (pt states he will borrow tub bench)    Recommendations for Other Services    Frequency  Min 2X/week    Precautions / Restrictions Precautions Precautions: Posterior Hip Precaution Comments: Reviewed 3/3 posterior hip precautions with pt. Restrictions Weight Bearing Restrictions: Yes LLE Weight Bearing: Weight bearing as tolerated   Pertinent Vitals/Pain See vitals    ADL  Upper Body Bathing: Simulated;Set up Where Assessed - Upper Body Bathing: Unsupported sitting Lower Body Bathing: Simulated;Supervision/safety (with long handled sponge) Where Assessed - Lower Body Bathing: Unsupported sit to stand Upper Body Dressing: Simulated;Set up Where Assessed - Upper Body Dressing: Unsupported sitting Lower Body Dressing: Performed;Minimal assistance (with reacher/sock aid) Where Assessed - Lower Body Dressing: Unsupported sit to stand Toilet Transfer: Simulated;Min guard Toilet Transfer Method: Sit to Barista:  (chair) Equipment Used: Gait belt;Rolling walker;Sock aid;Reacher;Long-handled sponge;Long-handled shoe horn Transfers/Ambulation Related to ADLs: min guard with RW ADL Comments: Educated pt on AE for LB bathing/dressing and availability of AE.  Pt states he will  have a family member go to gift shop for purchase AE. Reinforced hip precaution education and how these precautions impact ADLs/IADLs.    OT Diagnosis: Generalized weakness  OT Problem List: Decreased strength;Decreased activity tolerance;Decreased knowledge of use of DME or AE;Decreased knowledge of precautions OT Treatment Interventions: Self-care/ADL training;DME and/or AE instruction;Therapeutic activities;Patient/family education   OT Goals(Current goals can be found in the care plan section) Acute Rehab OT Goals Patient Stated Goal: home OT Goal Formulation: With patient Time For Goal Achievement: 01/05/13 Potential to Achieve Goals: Good  Visit Information  Last OT Received On: 12/29/12 Assistance Needed: +1 History of Present Illness: Pt admitted for L THA secondary to AVN       Prior Functioning     Home Living Family/patient expects to be discharged to:: Private residence Living Arrangements: Alone Available Help at Discharge: Family;Available 24 hours/day Type of Home: House Home Access: Stairs to enter [employer] of Steps: 2 Entrance Stairs-Rails: None Home Layout: One level Home Equipment: None Additional Comments: States he can borrow a tub transfer bench from a friend. Prior Function Level of Independence: Independent Communication Communication: No difficulties Dominant Hand: Right         Vision/Perception     Cognition  Cognition Arousal/Alertness: Awake/alert Behavior During Therapy: WFL for tasks assessed/performed Overall Cognitive Status: Within Functional Limits for tasks assessed    Extremity/Trunk Assessment Upper Extremity Assessment Upper Extremity Assessment: Overall WFL for tasks assessed     Mobility Bed Mobility Bed Mobility: Not assessed  Transfers Transfers: Sit to Stand;Stand to Sit Sit to Stand: 5: Supervision;From chair/3-in-1 Stand to Sit: 5: Supervision;To chair/3-in-1 Details for Transfer Assistance:  Cues for LLE positioning and hand placement     Exercise   Balance     End  of Session OT - End of Session Equipment Utilized During Treatment: Gait belt;Rolling walker Activity Tolerance: Patient tolerated treatment well Patient left: in chair;with call bell/phone within reach Nurse Communication: Mobility status  GO    12/29/2012 Cipriano Mile OTR/L Pager [phone] Office [phone]  Cipriano Mile 12/29/2012, 10:03 AM

## 2012-12-30 ENCOUNTER — Encounter (HOSPITAL_COMMUNITY): Payer: Self-pay | Admitting: Orthopedic Surgery

## 2012-12-30 LAB — CBC
HCT: 31.4 % — ABNORMAL LOW (ref 39.0–52.0)
Hemoglobin: 10.7 g/dL — ABNORMAL LOW (ref 13.0–17.0)
MCH: 33.3 pg (ref 26.0–34.0)
RBC: 3.21 MIL/uL — ABNORMAL LOW (ref 4.22–5.81)

## 2012-12-30 LAB — BASIC METABOLIC PANEL
CO2: 30 mEq/L (ref 19–32)
Calcium: 8.7 mg/dL (ref 8.4–10.5)
Glucose, Bld: 107 mg/dL — ABNORMAL HIGH (ref 70–99)
Potassium: 3.8 mEq/L (ref 3.5–5.1)
Sodium: 141 mEq/L (ref 135–145)

## 2012-12-30 MED ORDER — ASPIRIN EC 325 MG PO TBEC: 325.0000 mg | DELAYED_RELEASE_TABLET | Freq: Every day | ORAL | Status: DC

## 2012-12-30 MED ORDER — OXYCODONE-ACETAMINOPHEN 5-325 MG PO TABS: 1.0000 | ORAL_TABLET | ORAL | Status: DC | PRN

## 2012-12-30 NOTE — Discharge Summary (Signed)
Physician Discharge Summary  Patient ID: James Whitehead MRN: [id number] DOB/AGE: May 05, 1956 57 y.o.  Admit date: 12/28/2012 Discharge date: 12/30/2012  Admission Diagnoses: Avascular necrosis left hip Discharge Diagnoses:  Principal Problem:   Avascular necrosis of bone of left hip   Discharged Condition: stable  Hospital Course: Patient's hospital course was essentially unremarkable. He underwent total hip arthroplasty of the left hip. Postoperatively he progressed well and was discharged to home in stable condition.  Consults: None  Significant Diagnostic Studies: labs: Routine labs  Treatments: surgery: See operative note  Discharge Exam: Blood pressure 132/71, pulse 73, temperature 98.8 F (37.1 C), temperature source Oral, resp. rate 18, SpO2 98.00%. Incision/Wound: incision clean dry and intact  Disposition: Final discharge disposition not confirmed     Medication List    ASK your doctor about these medications       amLODipine 10 MG tablet  Commonly known as:  NORVASC  Take 10 mg by mouth daily.     ascorbic acid 1000 MG tablet  Commonly known as:  VITAMIN C  Take 2,000 mg by mouth daily.     carvedilol 6.25 MG tablet  Commonly known as:  COREG  Take 6.25 mg by mouth 2 (two) times daily with a meal.     fish oil-omega-3 fatty acids 1000 MG capsule  Take 4 g by mouth daily.     glucosamine-chondroitin 500-400 MG tablet  Take 2 tablets by mouth daily.     lisinopril 20 MG tablet  Commonly known as:  PRINIVIL,ZESTRIL  Take 20 mg by mouth daily.     VITAMIN B-12 PO  Take 1 tablet by mouth daily.     Vitamin D 2000 UNITS Caps  Take 2 capsules by mouth daily.           Follow-up Information   Follow up with Deloria Brassfield V, MD In 2 weeks.   Contact information:   [address] [place] [address] [phone]       Signed: Nadara Mustard 12/30/2012, 7:01 AM

## 2012-12-30 NOTE — Progress Notes (Signed)
Physical Therapy Treatment Patient Details Name: James Whitehead MRN: [id number] DOB: 1955/06/25 Today's Date: 12/30/2012 Time: 1130-1150 PT Time Calculation (min): 20 min  PT Assessment / Plan / Recommendation  History of Present Illness Pt admitted for L THA secondary to AVN   PT Comments   Pt. With no c/o pain and up in room ad lib without device.  I believe he will establish a better long range gait pattern if he continues to use RW for a while longer as she tands to have antalgic gait pattern currently.  He is agreeable to this. He is ready for Dc when orders given and ride arrives.    Follow Up Recommendations  Home health PT;Supervision/Assistance - 24 hour     Does the patient have the potential to tolerate intense rehabilitation     Barriers to Discharge        Equipment Recommendations  Rolling walker with 5" wheels;3in1 (PT)    Recommendations for Other Services OT consult  Frequency 7X/week   Progress towards PT Goals Progress towards PT goals: Progressing toward goals  Plan Current plan remains appropriate    Precautions / Restrictions Precautions Precautions: Posterior Hip Precaution Comments: pt. able to state 3/3 precautions Restrictions Weight Bearing Restrictions: Yes LLE Weight Bearing: Weight bearing as tolerated   Pertinent Vitals/Pain No pain    Mobility  Bed Mobility Bed Mobility: Not assessed Transfers Transfers: Sit to Stand;Stand to Sit Sit to Stand: 6: Modified independent (Device/Increase time) Stand to Sit: 6: Modified independent (Device/Increase time) Details for Transfer Assistance: Cues for LLE positioning and hand placement Ambulation/Gait Ambulation/Gait Assistance: 6: Modified independent (Device/Increase time) Ambulation Distance (Feet): 150 Feet Assistive device: Rolling walker;None Ambulation/Gait Assistance Details: Pt. up in room ad lib without device but was noted to have antalgic gait pattern.  Recommended to pt. that he use   assistive device for a while longer to  avoid antalgic gait pattern.  He is agreeable.  Gait Pattern: Step-through pattern;Within Functional Limits (with RW) Stairs: Yes Stairs Assistance: 5: Supervision Stairs Assistance Details (indicate cue type and reason): pt. able to state and demonstrate appropriate sequence and technique Stair Management Technique: No rails;Forwards Number of Stairs: 3    Exercises     PT Diagnosis:    PT Problem List:   PT Treatment Interventions:     PT Goals (current goals can now be found in the care plan section) Acute Rehab PT Goals Patient Stated Goal: home  Visit Information  Last PT Received On: 12/30/12 Assistance Needed: +1 History of Present Illness: Pt admitted for L THA secondary to AVN    Subjective Data  Subjective: says he thinks he overdid it yesterday by walking around unit 2x yesterday Patient Stated Goal: home   Cognition  Cognition Arousal/Alertness: Awake/alert Behavior During Therapy: WFL for tasks assessed/performed Overall Cognitive Status: Within Functional Limits for tasks assessed    Balance  Balance Balance Assessed: Yes Dynamic Standing Balance Dynamic Standing - Balance Support: No upper extremity supported;During functional activity Dynamic Standing - Level of Assistance: 6: Modified independent (Device/Increase time);5: Stand by assistance  End of Session PT - End of Session Equipment Utilized During Treatment: Gait belt Activity Tolerance: Patient tolerated treatment well Patient left: in chair;with call bell/phone within reach Nurse Communication: Mobility status   GP     Ferman Hamming 12/30/2012, 12:04 PM Weldon Picking PT Acute Rehab Services [phone] Beeper [phone]

## 2012-12-30 NOTE — Progress Notes (Signed)
Occupational Therapy Treatment Patient Details Name: James Whitehead MRN: [id number] DOB: July 24, 1955 Today's Date: 12/30/2012 Time: 6440-3474 OT Time Calculation (min): 12 min  OT Assessment / Plan / Recommendation  History of present illness Pt admitted for L THA secondary to AVN   OT comments  Pt progressing well and most likely will d/c home today. Pt able to return demo tub bench transfer with sup/Mod I  Follow Up Recommendations  Home health OT;Supervision/Assistance - 24 hour    Barriers to Discharge   None    Equipment Recommendations  3 in 1 bedside comode;Tub/shower bench;Other (comment) (ADL A/E)    Recommendations for Other Services    Frequency Min 2X/week   Progress towards OT Goals Progress towards OT goals: Progressing toward goals  Plan Discharge plan remains appropriate    Precautions / Restrictions Precautions Precautions: Posterior Hip Precaution Comments: Reviewed 3/3 posterior hip precautions with pt. Restrictions Weight Bearing Restrictions: Yes LLE Weight Bearing: Weight bearing as tolerated   Pertinent Vitals/Pain 2/10    ADL  Tub/Shower Transfer: Performed;Modified independent;Supervision/safety Tub/Shower Transfer Method: Science writer: Transfer tub bench ADL Comments: Reviewed ADL A/E with pt. Pt states that he will use his aunts A/E from he rprevious hip surgery at home    OT Diagnosis:    OT Problem List:   OT Treatment Interventions:     OT Goals(current goals can now be found in the care plan section) Acute Rehab OT Goals Patient Stated Goal: home  Visit Information  Last OT Received On: 12/30/12 History of Present Illness: Pt admitted for L THA secondary to AVN    Subjective Data      Prior Functioning       Cognition  Cognition Arousal/Alertness: Awake/alert Behavior During Therapy: WFL for tasks assessed/performed Overall Cognitive Status: Within Functional Limits for tasks assessed     Mobility  Bed Mobility Bed Mobility: Not assessed Transfers Transfers: Sit to Stand;Stand to Sit Sit to Stand: 6: Modified independent (Device/Increase time) Stand to Sit: 6: Modified independent (Device/Increase time)    Exercises      Balance Balance Balance Assessed: Yes Dynamic Standing Balance Dynamic Standing - Balance Support: No upper extremity supported;During functional activity Dynamic Standing - Level of Assistance: 6: Modified independent (Device/Increase time);5: Stand by assistance   End of Session OT - End of Session Equipment Utilized During Treatment: Rolling walker;Other (comment) (tub bench) Activity Tolerance: Patient tolerated treatment well Patient left: Other (comment) (ambulating with PT in hallway)  GO     Galen Manila 12/30/2012, 11:59 AM

## 2013-01-02 NOTE — Progress Notes (Signed)
01/02/2013 1200 Pt called and states he has not received call from [facility]. NCM did follow up with [facility] and pt has scheduled visit for today. Notified pt about HH soc 8/4. Isidoro Donning RN CCM Case Mgmt phone [phone]

## 2014-02-23 ENCOUNTER — Encounter: Payer: Self-pay | Admitting: Internal Medicine

## 2014-10-11 ENCOUNTER — Other Ambulatory Visit: Payer: Self-pay | Admitting: Internal Medicine

## 2014-10-11 DIAGNOSIS — R748 Abnormal levels of other serum enzymes: Secondary | ICD-10-CM

## 2014-10-22 ENCOUNTER — Ambulatory Visit
Admission: RE | Admit: 2014-10-22 | Discharge: 2014-10-22 | Disposition: A | Discharge status: Home / Self Care | Payer: BLUE CROSS/BLUE SHIELD | Source: Ambulatory Visit | Attending: Internal Medicine | Admitting: Internal Medicine

## 2014-10-22 DIAGNOSIS — R748 Abnormal levels of other serum enzymes: Secondary | ICD-10-CM

## 2015-02-11 ENCOUNTER — Other Ambulatory Visit: Payer: Self-pay | Admitting: Urology

## 2015-02-22 ENCOUNTER — Encounter (HOSPITAL_BASED_OUTPATIENT_CLINIC_OR_DEPARTMENT_OTHER): Payer: Self-pay | Admitting: *Deleted

## 2015-02-22 NOTE — Progress Notes (Signed)
NPO AFTER MN.  ARRIVE AT 0715.  NEEDS ISTAT AND EKG. WILL TAKE NORVASC AND COREG AM DOS W/ SIPS OF WATER.

## 2015-03-01 ENCOUNTER — Encounter (HOSPITAL_BASED_OUTPATIENT_CLINIC_OR_DEPARTMENT_OTHER)
Admission: RE | Disposition: A | Discharge status: Home / Self Care | Payer: Self-pay | Source: Ambulatory Visit | Attending: Urology

## 2015-03-01 ENCOUNTER — Ambulatory Visit (HOSPITAL_BASED_OUTPATIENT_CLINIC_OR_DEPARTMENT_OTHER): Payer: BLUE CROSS/BLUE SHIELD | Admitting: Anesthesiology

## 2015-03-01 ENCOUNTER — Encounter (HOSPITAL_BASED_OUTPATIENT_CLINIC_OR_DEPARTMENT_OTHER): Payer: Self-pay | Admitting: *Deleted

## 2015-03-01 ENCOUNTER — Ambulatory Visit (HOSPITAL_BASED_OUTPATIENT_CLINIC_OR_DEPARTMENT_OTHER)
Admission: RE | Admit: 2015-03-01 | Discharge: 2015-03-01 | Disposition: A | Discharge status: Home / Self Care | Payer: BLUE CROSS/BLUE SHIELD | Source: Ambulatory Visit | Attending: Urology | Admitting: Urology

## 2015-03-01 DIAGNOSIS — M1990 Unspecified osteoarthritis, unspecified site: Secondary | ICD-10-CM | POA: Insufficient documentation

## 2015-03-01 DIAGNOSIS — Z87891 Personal history of nicotine dependence: Secondary | ICD-10-CM | POA: Insufficient documentation

## 2015-03-01 DIAGNOSIS — Z96642 Presence of left artificial hip joint: Secondary | ICD-10-CM | POA: Diagnosis not present

## 2015-03-01 DIAGNOSIS — N4340 Spermatocele of epididymis, unspecified: Secondary | ICD-10-CM | POA: Diagnosis not present

## 2015-03-01 DIAGNOSIS — I10 Essential (primary) hypertension: Secondary | ICD-10-CM | POA: Insufficient documentation

## 2015-03-01 HISTORY — DX: Spermatocele of epididymis, unspecified: N43.40

## 2015-03-01 HISTORY — DX: Hydrocele, unspecified: N43.3

## 2015-03-01 HISTORY — PX: ORCHIOPEXY: SHX479

## 2015-03-01 HISTORY — PX: HYDROCELE EXCISION: SHX482

## 2015-03-01 LAB — POCT I-STAT 4, (NA,K, GLUC, HGB,HCT)
Glucose, Bld: 113 mg/dL — ABNORMAL HIGH (ref 65–99)
HEMATOCRIT: 47 % (ref 39.0–52.0)
HEMOGLOBIN: 16 g/dL (ref 13.0–17.0)
Potassium: 3.9 mmol/L (ref 3.5–5.1)
SODIUM: 141 mmol/L (ref 135–145)

## 2015-03-01 SURGERY — HYDROCELECTOMY
Anesthesia: General | Site: Scrotum | Laterality: Left

## 2015-03-01 MED ORDER — OXYCODONE-ACETAMINOPHEN 5-325 MG PO TABS: 1.0000 | ORAL_TABLET | Freq: Four times a day (QID) | ORAL | Status: DC | PRN

## 2015-03-01 MED ORDER — SENNOSIDES-DOCUSATE SODIUM 8.6-50 MG PO TABS: 1.0000 | ORAL_TABLET | Freq: Two times a day (BID) | ORAL | Status: DC

## 2015-03-01 MED ORDER — MIDAZOLAM HCL 2 MG/2ML IJ SOLN
INTRAMUSCULAR | Status: AC
  Filled 2015-03-01: qty 2

## 2015-03-01 MED ORDER — PROMETHAZINE HCL 25 MG/ML IJ SOLN
6.2500 mg | INTRAMUSCULAR | Status: DC | PRN
  Filled 2015-03-01: qty 1

## 2015-03-01 MED ORDER — ACETAMINOPHEN 10 MG/ML IV SOLN
INTRAVENOUS | Status: DC | PRN
  Administered 2015-03-01: 1000 mg via INTRAVENOUS

## 2015-03-01 MED ORDER — LIDOCAINE HCL (CARDIAC) 20 MG/ML IV SOLN
INTRAVENOUS | Status: DC | PRN
  Administered 2015-03-01: 100 mg via INTRAVENOUS

## 2015-03-01 MED ORDER — BUPIVACAINE HCL (PF) 0.25 % IJ SOLN
INTRAMUSCULAR | Status: DC | PRN
  Administered 2015-03-01: 10 mL

## 2015-03-01 MED ORDER — FENTANYL CITRATE (PF) 100 MCG/2ML IJ SOLN
25.0000 ug | INTRAMUSCULAR | Status: DC | PRN
  Filled 2015-03-01: qty 1

## 2015-03-01 MED ORDER — CLINDAMYCIN PHOSPHATE 900 MG/50ML IV SOLN
900.0000 mg | INTRAVENOUS | Status: AC
  Administered 2015-03-01: 900 mg via INTRAVENOUS
  Filled 2015-03-01: qty 50

## 2015-03-01 MED ORDER — MEPERIDINE HCL 25 MG/ML IJ SOLN
6.2500 mg | INTRAMUSCULAR | Status: DC | PRN
  Filled 2015-03-01: qty 1

## 2015-03-01 MED ORDER — CLINDAMYCIN PHOSPHATE 900 MG/50ML IV SOLN
INTRAVENOUS | Status: AC
  Filled 2015-03-01: qty 50

## 2015-03-01 MED ORDER — LACTATED RINGERS IV SOLN
INTRAVENOUS | Status: DC
  Administered 2015-03-01: 08:00:00 via INTRAVENOUS
  Filled 2015-03-01: qty 1000

## 2015-03-01 MED ORDER — FENTANYL CITRATE (PF) 100 MCG/2ML IJ SOLN
INTRAMUSCULAR | Status: DC | PRN
  Administered 2015-03-01 (×3): 25 ug via INTRAVENOUS

## 2015-03-01 MED ORDER — FENTANYL CITRATE (PF) 100 MCG/2ML IJ SOLN
INTRAMUSCULAR | Status: AC
  Filled 2015-03-01: qty 2

## 2015-03-01 MED ORDER — SODIUM CHLORIDE 0.9 % IR SOLN
Status: DC | PRN
  Administered 2015-03-01: 500 mL

## 2015-03-01 MED ORDER — PROPOFOL 10 MG/ML IV BOLUS
INTRAVENOUS | Status: DC | PRN
  Administered 2015-03-01: 200 mg via INTRAVENOUS

## 2015-03-01 MED ORDER — LACTATED RINGERS IV SOLN
INTRAVENOUS | Status: DC
  Filled 2015-03-01: qty 1000

## 2015-03-01 MED ORDER — ONDANSETRON HCL 4 MG/2ML IJ SOLN
INTRAMUSCULAR | Status: DC | PRN
  Administered 2015-03-01: 4 mg via INTRAVENOUS

## 2015-03-01 MED ORDER — MIDAZOLAM HCL 5 MG/5ML IJ SOLN
INTRAMUSCULAR | Status: DC | PRN
  Administered 2015-03-01: 2 mg via INTRAVENOUS

## 2015-03-01 SURGICAL SUPPLY — 39 items
BLADE CLIPPER SURG (BLADE) ×1 IMPLANT
BLADE HEX COATED 2.75 (ELECTRODE) ×2 IMPLANT
BLADE SURG 15 STRL LF DISP TIS (BLADE) ×1 IMPLANT
BLADE SURG 15 STRL SS (BLADE) ×2
BNDG GAUZE ELAST 4 BULKY (GAUZE/BANDAGES/DRESSINGS) ×2 IMPLANT
COVER BACK TABLE 60X90IN (DRAPES) ×2 IMPLANT
COVER MAYO STAND STRL (DRAPES) ×2 IMPLANT
DISSECTOR ROUND CHERRY 3/8 STR (MISCELLANEOUS) IMPLANT
DRAIN PENROSE 18X1/4 LTX STRL (WOUND CARE) ×2 IMPLANT
DRAPE PED LAPAROTOMY (DRAPES) ×2 IMPLANT
ELECT REM PT RETURN 9FT ADLT (ELECTROSURGICAL) ×2
ELECTRODE REM PT RTRN 9FT ADLT (ELECTROSURGICAL) ×1 IMPLANT
GLOVE BIO SURGEON STRL SZ 6.5 (GLOVE) ×1 IMPLANT
GLOVE BIO SURGEON STRL SZ7.5 (GLOVE) ×3 IMPLANT
GLOVE INDICATOR 6.5 STRL GRN (GLOVE) ×1 IMPLANT
GOWN STRL REUS W/ TWL LRG LVL3 (GOWN DISPOSABLE) ×1 IMPLANT
GOWN STRL REUS W/ TWL XL LVL3 (GOWN DISPOSABLE) ×1 IMPLANT
GOWN STRL REUS W/TWL LRG LVL3 (GOWN DISPOSABLE) ×2
GOWN STRL REUS W/TWL XL LVL3 (GOWN DISPOSABLE) ×2
LIQUID BAND (GAUZE/BANDAGES/DRESSINGS) ×1 IMPLANT
NDL HYPO 25X1 1.5 SAFETY (NEEDLE) ×1 IMPLANT
NEEDLE HYPO 25X1 1.5 SAFETY (NEEDLE) ×2 IMPLANT
NS IRRIG 500ML POUR BTL (IV SOLUTION) ×1 IMPLANT
PACK BASIN DAY SURGERY FS (CUSTOM PROCEDURE TRAY) ×2 IMPLANT
PENCIL BUTTON HOLSTER BLD 10FT (ELECTRODE) ×2 IMPLANT
SPONGE GAUZE 4X4 12PLY STER LF (GAUZE/BANDAGES/DRESSINGS) ×1 IMPLANT
SUPPORT SCROTAL LG STRP (MISCELLANEOUS) ×2 IMPLANT
SUT ETHILON 3 0 PS 1 (SUTURE) ×1 IMPLANT
SUT MNCRL AB 4-0 PS2 18 (SUTURE) ×2 IMPLANT
SUT PROLENE 4 0 PS 2 18 (SUTURE) ×1 IMPLANT
SUT SILK 2 0 (SUTURE) ×2
SUT SILK 2-0 18XBRD TIE 12 (SUTURE) IMPLANT
SUT VIC AB 4-0 SH 27 (SUTURE) ×2
SUT VIC AB 4-0 SH 27XANBCTRL (SUTURE) ×1 IMPLANT
SYR CONTROL 10ML LL (SYRINGE) ×2 IMPLANT
TOWEL OR 17X24 6PK STRL BLUE (TOWEL DISPOSABLE) ×4 IMPLANT
TRAY DSU PREP LF (CUSTOM PROCEDURE TRAY) ×2 IMPLANT
TUBE CONNECTING 12X1/4 (SUCTIONS) ×2 IMPLANT
YANKAUER SUCT BULB TIP NO VENT (SUCTIONS) ×2 IMPLANT

## 2015-03-01 NOTE — Anesthesia Preprocedure Evaluation (Signed)
Anesthesia Evaluation  Patient identified by MRN, date of birth, ID band Patient awake    Reviewed: Allergy & Precautions, H&P , NPO status , Patient's Chart, lab work & pertinent test results, reviewed documented beta blocker date and time   History of Anesthesia Complications Negative for: history of anesthetic complications  Airway Mallampati: I  TM Distance: >3 FB Neck ROM: Full    Dental  (+) Teeth Intact, Chipped, Dental Advisory Given, Caps,    Pulmonary neg pulmonary ROS,    Pulmonary exam normal breath sounds clear to auscultation       Cardiovascular hypertension, Pt. on medications and Pt. on home beta blockers Normal cardiovascular exam Rhythm:Regular Rate:Normal     Neuro/Psych negative neurological ROS     GI/Hepatic negative GI ROS, Neg liver ROS,   Endo/Other  negative endocrine ROS  Renal/GU negative Renal ROS  negative genitourinary   Musculoskeletal  (+) Arthritis , Osteoarthritis,    Abdominal   Peds  Hematology negative hematology ROS (+)   Anesthesia Other Findings   Reproductive/Obstetrics                             Anesthesia Physical  Anesthesia Plan  ASA: II  Anesthesia Plan: General   Post-op Pain Management:    Induction: Intravenous  Airway Management Planned: LMA  Additional Equipment:   Intra-op Plan:   Post-operative Plan: Extubation in OR  Informed Consent: I have reviewed the patients History and Physical, chart, labs and discussed the procedure including the risks, benefits and alternatives for the proposed anesthesia with the patient or authorized representative who has indicated his/her understanding and acceptance.   Dental advisory given  Plan Discussed with: Surgeon and CRNA  Anesthesia Plan Comments:         Anesthesia Quick Evaluation

## 2015-03-01 NOTE — Brief Op Note (Signed)
03/01/2015  9:31 AM  PATIENT:  Romeo Apple  59 y.o. male  PRE-OPERATIVE DIAGNOSIS:  LARGE LEFT HYDROCELE / SPERMATOCELE  POST-OPERATIVE DIAGNOSIS:  LARGE LEFT HYDROCELE / SPERMATOCELE  PROCEDURE:  Procedure(s): HYDROCELECTOMY ADULT (Left) ORCHIOPEXY ADULT (Left)  SURGEON:  Surgeon(s) and Role:    * Alexis Frock, MD - Primary  PHYSICIAN ASSISTANT:   ASSISTANTS: none   ANESTHESIA:   local and general  EBL:  Total I/O In: 500 [I.V.:500] Out: -   BLOOD ADMINISTERED:none  DRAINS: Penrose drain in the dependant scrotum   LOCAL MEDICATIONS USED:  MARCAINE     SPECIMEN:  Source of Specimen:  left spermatocele  DISPOSITION OF SPECIMEN:  PATHOLOGY  COUNTS:  YES  TOURNIQUET:  * No tourniquets in log *  DICTATION: .Other Dictation: Dictation Number [id number]  PLAN OF CARE: Discharge to home after PACU  PATIENT DISPOSITION:  PACU - hemodynamically stable.   Delay start of Pharmacological VTE agent (>24hrs) due to surgical blood loss or risk of bleeding: yes

## 2015-03-01 NOTE — Anesthesia Procedure Notes (Signed)
Procedure Name: LMA Insertion Date/Time: 03/01/2015 8:47 AM Performed by: Wanita Chamberlain Pre-anesthesia Checklist: Patient identified, Timeout performed, Emergency Drugs available, Suction available and Patient being monitored Patient Re-evaluated:Patient Re-evaluated prior to inductionOxygen Delivery Method: Circle system utilized Preoxygenation: Pre-oxygenation with 100% oxygen Intubation Type: IV induction Ventilation: Mask ventilation without difficulty LMA: LMA inserted LMA Size: 4.0 Number of attempts: 1 Airway Equipment and Method: Bite block Placement Confirmation: breath sounds checked- equal and bilateral and positive ETCO2 Tube secured with: Tape

## 2015-03-01 NOTE — H&P (Signed)
James Whitehead is an 59 y.o. male.    Chief Complaint: Pre-OP Left spermatocele / hydrocelectomy  HPI:    1 - Left Spermatocele - Pt with progressive left scrotal swelling initially noted 2014. Scrotal US with 5cm left spermaotocele, no intratesticular masses. Area is moderately botherwome from mass effect, but not painful, and has initially opted for observation. 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   PMH sig for shoulder surgery, bilt inguinal hernia repair with mesh, left hip replacemnt   Today James Whitehead is seen to proceed with elective repair of left spermotocele v. Hydrocele.   Past Medical History  Diagnosis Date  . Hypertension   . Arthritis   . Left hydrocele   . Spermatocele     left    Past Surgical History  Procedure Laterality Date  . Total hip arthroplasty Left 12/28/2012    Procedure: TOTAL HIP ARTHROPLASTY- left;  Surgeon: Newt Minion, MD;  Location: [facility];  Service: Orthopedics;  Laterality: Left;  Left Total Hip Arthroplasty  . Tonsillectomy  as child  . Inguinal hernia repair Bilateral 2006    History reviewed. No pertinent family history. Social History:  reports that he has never smoked. He quit smokeless tobacco use about 38 years ago. His smokeless tobacco use included Chew. He reports that he drinks alcohol. He reports that he does not use illicit drugs.  Allergies: No Known Allergies  No prescriptions prior to admission    No results found for this or any previous visit (from the past 48 hour(s)). No results found.  Review of Systems  Constitutional: Negative.  Negative for fever and chills.  HENT: Negative.   Eyes: Negative.   Respiratory: Negative.   Cardiovascular: Negative.  Negative for chest pain.  Gastrointestinal: Negative.   Genitourinary: Negative.  Negative for frequency, hematuria and flank pain.  Musculoskeletal: Negative.    Skin: Negative.   Neurological: Negative.   Endo/Heme/Allergies: Negative.   Psychiatric/Behavioral: Negative.     Height 6' (1.829 m), weight 92.08 kg (203 lb). Physical Exam  Constitutional: He appears well-developed.  HENT:  Head: Normocephalic.  Eyes: Pupils are equal, round, and reactive to light.  Neck: Normal range of motion.  Cardiovascular: Normal rate.   Respiratory: Effort normal.  GI: Soft.  Genitourinary: Penis normal.  approx 5cm soft, mobile likely left spermatocele v. hydrocele  Musculoskeletal: Normal range of motion.  Neurological: He is alert.  Skin: Skin is warm.  Psychiatric: He has a normal mood and affect. His behavior is normal. Judgment and thought content normal.     Assessment/Plan        1 - Left Spermatocele - progressed in size and bother. Piror [place] confirmed simple fluid (spermatocele / hydrocele) and not hernia.    We rediscussed operative hydrocelecotmy/spermatocelectomy in detail including usual outpatient perioperative course with >50% of cases requiring temporary penrose drian to reduce risk of hematoma as well as importance of post-op activity limitation and scrotal support. We rediscussed risks including recurrence, hematoma / bleeding, infection as well as rare risks such as DVT, PE, MI, CVA, Mortality. The patient voiced understanding and wants to proceed today as planned.   I rementioned he would need penrose x few days to help prevent hematoma.   James Whitehead, THEODORE 03/01/2015, 6:20 AM

## 2015-03-01 NOTE — Transfer of Care (Signed)
Immediate Anesthesia Transfer of Care Note  Patient: James Whitehead  Procedure(s) Performed: Procedure(s): HYDROCELECTOMY ADULT (Left) ORCHIOPEXY ADULT (Left)  Patient Location: PACU  Anesthesia Type:General  Level of Consciousness: awake, alert , oriented and patient cooperative  Airway & Oxygen Therapy: Patient Spontanous Breathing and Patient connected to nasal cannula oxygen  Post-op Assessment: Report given to RN and Post -op Vital signs reviewed and stable  Post vital signs: Reviewed and stable  Last Vitals:  Filed Vitals:   03/01/15 0712  BP: 142/79  Pulse: 69  Temp: 37.1 C  Resp: 16    Complications: No apparent anesthesia complications

## 2015-03-01 NOTE — Anesthesia Postprocedure Evaluation (Signed)
  Anesthesia Post-op Note  Patient: James Whitehead  Procedure(s) Performed: Procedure(s) (LRB): HYDROCELECTOMY ADULT (Left) ORCHIOPEXY ADULT (Left)  Patient Location: PACU  Anesthesia Type: General  Level of Consciousness: awake and alert   Airway and Oxygen Therapy: Patient Spontanous Breathing  Post-op Pain: mild  Post-op Assessment: Post-op Vital signs reviewed, Patient's Cardiovascular Status Stable, Respiratory Function Stable, Patent Airway and No signs of Nausea or vomiting  Last Vitals:  Filed Vitals:   03/01/15 1116  BP: 133/80  Pulse: 60  Temp: 36.8 C  Resp: 14    Post-op Vital Signs: stable   Complications: No apparent anesthesia complications

## 2015-03-01 NOTE — Discharge Instructions (Signed)
1 - You may have bloody discharge from area of drain on / off x few days. This is normal.  2 - Call MD or go to ER for fever >102, severe pain / nausea / vomiting not relieved by medications, or acute change in medical status    [place]: You may apply an ice bag to the scrotum for the first 24 hours.  This may help decrease swelling and soreness.  You may have a dressing held in place by an athletic supporter.  You may remove the dressing in 24 hours and shower in 48 hours.  Continue to use the athletic supporter or tight briefs for at least a week. Activity: Rest today - not necessarily flat bed rest.  Just take it easy.  You should not do strenuous activities until your follow-up visit with your doctor.  You may resume light activity in 48 hours.  Return to Work:  Your doctor will advise you of this depending on the type of work you do  Diet: Drink liquids or eat a light diet this evening.  You may resume a regular diet tomorrow.  General Expectations: You may have a small amount of bleeding.  The scrotum may be swollen or bruised for about a week.  Call your Doctor if these occur:  -persistent or heavy bleeding  -temperature of 101 degrees or more  -severe pain, not relieved by your pain medication  Return to [employer]:  Call to set up and appointment.  Patient Signature:  __________________________________________________  Nurse's Signature:  __________________________________________________  Post Anesthesia Home Care Instructions  Activity: Get plenty of rest for the remainder of the day. A responsible adult should stay with you for 24 hours following the procedure.  For the next 24 hours, DO NOT: -Drive a car -Paediatric nurse -Drink alcoholic beverages -Take any medication unless instructed by your physician -Make any legal decisions or sign important papers.  Meals: Start with liquid foods such  as gelatin or soup. Progress to regular foods as tolerated. Avoid greasy, spicy, heavy foods. If nausea and/or vomiting occur, drink only clear liquids until the nausea and/or vomiting subsides. Call your physician if vomiting continues.  Special Instructions/Symptoms: Your throat may feel dry or sore from the anesthesia or the breathing tube placed in your throat during surgery. If this causes discomfort, gargle with warm salt water. The discomfort should disappear within 24 hours.  If you had a scopolamine patch placed behind your ear for the management of post- operative nausea and/or vomiting:  1. The medication in the patch is effective for 72 hours, after which it should be removed.  Wrap patch in a tissue and discard in the trash. Wash hands thoroughly with soap and water. 2. You may remove the patch earlier than 72 hours if you experience unpleasant side effects which may include dry mouth, dizziness or visual disturbances. 3. Avoid touching the patch. Wash your hands with soap and water after contact with the patch.

## 2015-03-04 ENCOUNTER — Encounter (HOSPITAL_BASED_OUTPATIENT_CLINIC_OR_DEPARTMENT_OTHER): Payer: Self-pay | Admitting: Urology

## 2015-03-04 NOTE — Op Note (Signed)
NAMEJAWARA, LATORRE NO.:  [id number]  MEDICAL RECORD NO.:  [id number]  LOCATION:                               FACILITY:  [facility]  PHYSICIAN:  Alexis Frock, MD     DATE OF BIRTH:  17-Mar-1956  DATE OF PROCEDURE:  03/01/2015                              OPERATIVE REPORT  PREOPERATIVE DIAGNOSIS:  Large left spermatocele.  POSTOPERATIVE DIAGNOSIS:  Large left spermatocele.  PROCEDURES: 1. Left spermatocelectomy. 2. Left orchiopexy.  ESTIMATED BLOOD LOSS:  Nil.  COMPLICATIONS:  None.  SPECIMEN:  Left spermatocele for permanent pathology.  DRAIN:  Penrose drain and dependent scrotum, counterincision to wound drainage.  FINDINGS: 1. Large multiloculated left spermatocele. 2. Significant mobilization of left testicle making left orchiopexy     necessary to prevent future torsion. 3. Successful anchoring of left testicle as the lateral sulcus     lateralis twisting in the cord.  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  He underwent evaluation of this previously with ultrasound much corroborated likely simple spermatocele, initially was managed with observation; however, his bother has become more so over the last several years and he now wished to proceed with elective repair. Physical exam corroborated likely less spermatocele.  He has no history of any sort of infection, so the elective left spermatocele was warranted and he wished to proceed.  Informed consent was obtained and placed in the medical record.  PROCEDURE IN DETAIL:  The patient being Vandy Tsuchiya, was verified. Procedure being left spermatocelectomy was confirmed.  Procedure was carried out.  Time-out was performed.  Intravenous antibiotics were administered.  General LMA anesthesia was introduced.  The patient was placed into a supine position.  Sterile field was created by  prepping and draping the patient's penis, perineum, proximal thighs, and scrotum using iodinated prep after clipper shaving of the scrotum.  Next, a linear incision was made along the median raphe approximately 5 cm in length directly onto the area of left scrotal swelling.  Dissection proceeded down to dartos and subcutaneous layers in the left testicle and spermatocele, complex was delivered in the operative field. Dissection was carried down very carefully systematically layer by layer, separating the spermatocele from surrounding tissues including the gonadal vessels and vas.  Great care was taken to avoid injury to the structures, which did not occur.  Dissection proceeded down until the spermatocelectomy was on a very small stalk with connection to the epididymal head and testicle area, stalk diameter approximately 7 mm. This was doubly ligated with silk and spermatocelectomy specimen was set aside for permanent pathology.  The left appendix testis was encountered and ablated to prevent torsion of the structure.  Given the significant mobilization of the left testicle that was quite mobile and was felt to be at significant risk for possible future torsion.  As such, the left testicle was carefully laid out in proper orientation of the lateral sulcus lateralis ensuring no twisting of the cord and orchiopexy was performed using 4-0 Prolene to the lateral mesorchium to the anterolateral scrotal leaflet as well as an inferior stitch  in an interrupted fashion, taking great care to avoid testicular risk and perforation, which did not occur.  Small counterincision was made in the dependent scrotum, through which, a 0.25-inch Penrose drain was brought into the left hemiscrotum for wound drainage.  Additional hemostasis was achieved with point cautery, resulted in excellent hemostasis.  The dartos was reapproximated using running Vicryl and the skin reapproximated with subcuticular Monocryl  followed by Dermabond. Dressing was applied and the dressing of scrotal support with fluff was prepared and applied and the procedure was terminated.  The patient tolerated the procedure well.  There were no immediate periprocedural complications.  The patient was taken to the postanesthesia care unit in stable condition.          ______________________________ Alexis Frock, MD     TM/MEDQ  D:  03/01/2015  T:  03/02/2015  Job:  [id number]

## 2016-07-15 ENCOUNTER — Ambulatory Visit (INDEPENDENT_AMBULATORY_CARE_PROVIDER_SITE_OTHER): Payer: Self-pay

## 2016-07-15 ENCOUNTER — Ambulatory Visit (INDEPENDENT_AMBULATORY_CARE_PROVIDER_SITE_OTHER): Payer: BLUE CROSS/BLUE SHIELD | Admitting: Orthopedic Surgery

## 2016-07-15 DIAGNOSIS — M79644 Pain in right finger(s): Secondary | ICD-10-CM | POA: Diagnosis not present

## 2016-07-15 NOTE — Progress Notes (Signed)
Office Visit Note   Patient: James Whitehead           Date of Birth: [birth year]           MRN: [id number] Visit Date: 07/15/2016              Requested by: [name], MD [address] [place] [place], [place] [address] PCP: [name], MD  Chief Complaint  Patient presents with  . Right Hand - Pain    HPI: The patient is a 61 year old gentleman who is seen today for evaluation of right hand index finger weakness and loss of flexion. Did fall on an out stretched hand before Christmas. Denies pain or injury. Has no pain with this. No triggering or catching. Swelling and "lump" at the 2nd PIP joint.   Patient states he has few  Assessment & Plan: Visit Diagnoses:  1. Pain in finger of right hand     Plan: Patient has swelling around the PIP joint but no ligamentous or tendon instability. He'll work on range of motion exercises no restrictions with use follow-up as needed.  Follow-Up Instructions: Return if symptoms worsen or fail to improve.   Ortho Exam Examination patient is alert oriented no adenopathy well-dressed 1 affect normal respiratory effort. Examination of his hand he has full flexion of the PIP joint to 90 DIP joint to 45 he can bring the index finger to within 1 finger breath of the distal palmar crease of the right hand. Patient has no varus or valgus instability. FDS and FDP are intact he has full active extension. There is swelling around the PIP joint.  Imaging: Xr Finger Index Right  Result Date: 07/15/2016 Three-view radiographs of right index finger shows a congruent MCP PIP and DIP joints. No evidence of fracture no angular deformity.   Orders:  Orders Placed This Encounter  Procedures  . XR Finger Index Right   No orders of the defined types were placed in this encounter.    Procedures: No procedures performed  Clinical Data: No additional findings.  Subjective: Review of Systems  Objective: Vital Signs: There were no vitals taken  for this visit.  Specialty Comments:  No specialty comments available.  PMFS History: Patient Active Problem List   Diagnosis Date Noted  . Pain in finger of right hand 07/15/2016  . Avascular necrosis of bone of left hip ([place]) 12/29/2012   Past Medical History:  Diagnosis Date  . Arthritis   . Hypertension   . Left hydrocele   . Spermatocele    left    No family history on file.  Past Surgical History:  Procedure Laterality Date  . HYDROCELE EXCISION Left 03/01/2015   Procedure: HYDROCELECTOMY ADULT;  Surgeon: Alexis Frock, MD;  Location: [facility];  Service: Urology;  Laterality: Left;  . INGUINAL HERNIA REPAIR Bilateral 2006  . ORCHIOPEXY Left 03/01/2015   Procedure: ORCHIOPEXY ADULT;  Surgeon: Alexis Frock, MD;  Location: [facility];  Service: Urology;  Laterality: Left;  . TONSILLECTOMY  as child  . TOTAL HIP ARTHROPLASTY Left 12/28/2012   Procedure: TOTAL HIP ARTHROPLASTY- left;  Surgeon: Newt Minion, MD;  Location: [place];  Service: Orthopedics;  Laterality: Left;  Left Total Hip Arthroplasty   Social History   Occupational History  . Not on file.   Social History Main Topics  . Smoking status: Never Smoker  . Smokeless tobacco: Former Systems developer    Types: Loss adjuster, chartered  Quit date: 10/30/1976  . Alcohol use Yes  . Drug use: No  . Sexual activity: Not on file

## 2017-08-19 DIAGNOSIS — E7800 Pure hypercholesterolemia, unspecified: Secondary | ICD-10-CM | POA: Diagnosis not present

## 2017-08-19 DIAGNOSIS — E559 Vitamin D deficiency, unspecified: Secondary | ICD-10-CM | POA: Diagnosis not present

## 2017-08-26 DIAGNOSIS — G8929 Other chronic pain: Secondary | ICD-10-CM | POA: Diagnosis not present

## 2017-08-26 DIAGNOSIS — M5442 Lumbago with sciatica, left side: Secondary | ICD-10-CM | POA: Diagnosis not present

## 2017-10-12 ENCOUNTER — Ambulatory Visit (INDEPENDENT_AMBULATORY_CARE_PROVIDER_SITE_OTHER): Payer: 59 | Admitting: Orthopedic Surgery

## 2017-10-12 ENCOUNTER — Ambulatory Visit (INDEPENDENT_AMBULATORY_CARE_PROVIDER_SITE_OTHER): Payer: 59

## 2017-10-12 ENCOUNTER — Encounter (INDEPENDENT_AMBULATORY_CARE_PROVIDER_SITE_OTHER): Payer: Self-pay | Admitting: Orthopedic Surgery

## 2017-10-12 VITALS — Ht 72.0 in | Wt 203.0 lb

## 2017-10-12 DIAGNOSIS — M5410 Radiculopathy, site unspecified: Secondary | ICD-10-CM | POA: Diagnosis not present

## 2017-10-12 DIAGNOSIS — R200 Anesthesia of skin: Secondary | ICD-10-CM | POA: Diagnosis not present

## 2017-10-12 DIAGNOSIS — M545 Low back pain: Secondary | ICD-10-CM

## 2017-10-12 MED ORDER — PREDNISONE 10 MG PO TABS
20.0000 mg | ORAL_TABLET | Freq: Every day | ORAL | 0 refills | Status: DC
Start: 2017-10-12 — End: 2018-06-24

## 2017-10-12 NOTE — Progress Notes (Signed)
Office Visit Note   Patient: James Whitehead           Date of Birth: 05-13-1956           MRN: [id number] Visit Date: 10/12/2017              Requested by: [name], MD [address] [place], [place] [address] PCP: [name], MD  Chief Complaint  Patient presents with  . Right Leg - Numbness  . Left Leg - Numbness      HPI: Patient is a 62 year old gentleman who presents complaining of numbness in both lower extremities on the anterior aspect of the tibial crest as well as radicular pain to the left ankle.  Patient states is worse with sitting states he does not have back pain.  Assessment & Plan: Visit Diagnoses:  1. Bilateral leg numbness   2. Radicular pain of both lower extremities     Plan: Patient is called in a prescription for prednisone 20 mg with breakfast he will wean off this as his symptoms resolved.  Discussed that if he is still symptomatic at follow-up we would need to set him up for an MRI scan to further evaluate his disc pathology.  Examination patient has normal gait.  He has a negative straight leg raise bilaterally.  Has pain with  Follow-Up Instructions: Return in about 3 weeks (around 11/02/2017).   Ortho Exam  Patient is alert, oriented, no adenopathy, well-dressed, normal affect, normal respiratory effort. Hip flexion with a little bit of weakness with hip flexion he has good plantarflexion and dorsiflexion strength of both lower extremities with a negative straight leg raise bilaterally.  Imaging: Xr Lumbar Spine 2-3 Views  Result Date: 10/12/2017 2 view radiographs of the lumbar spine shows a left total hip arthroplasty osteoarthritis of the right hip with bone-on-bone contact with degenerative disc disease of lumbar spine with periodic take your bony spurs joint space narrowing with essentially bone-on-bone contact L5-S1 as well as degenerative changes throughout the lumbar spine.  There is no calcification of the aorta there are no  compression fractures.  No images are attached to the encounter.  Labs: No results found for: HGBA1C, ESRSEDRATE, CRP, LABURIC, REPTSTATUS, GRAMSTAIN, CULT, LABORGA   Lab Results  Component Value Date   ALBUMIN 4.3 12/20/2012    Body mass index is 27.53 kg/m.  Orders:  Orders Placed This Encounter  Procedures  . XR Lumbar Spine 2-3 Views   No orders of the defined types were placed in this encounter.    Procedures: No procedures performed  Clinical Data: No additional findings.  ROS:  All other systems negative, except as noted in the HPI. Review of Systems  Objective: Vital Signs: Ht 6' (1.829 m)   Wt 203 lb (92.1 kg)   BMI 27.53 kg/m   Specialty Comments:  No specialty comments available.  PMFS History: Patient Active Problem List   Diagnosis Date Noted  . Pain in finger of right hand 07/15/2016  . Avascular necrosis of bone of left hip ([place]) 12/29/2012   Past Medical History:  Diagnosis Date  . Arthritis   . Hypertension   . Left hydrocele   . Spermatocele    left    History reviewed. No pertinent family history.  Past Surgical History:  Procedure Laterality Date  . HYDROCELE EXCISION Left 03/01/2015   Procedure: HYDROCELECTOMY ADULT;  Surgeon: Alexis Frock, MD;  Location: [facility];  Service: Urology;  Laterality: Left;  .  INGUINAL HERNIA REPAIR Bilateral 2006  . ORCHIOPEXY Left 03/01/2015   Procedure: ORCHIOPEXY ADULT;  Surgeon: Alexis Frock, MD;  [facility];  Service: Urology;  Laterality: Left;  . TONSILLECTOMY  as child  . TOTAL HIP ARTHROPLASTY Left 12/28/2012   Procedure: TOTAL HIP ARTHROPLASTY- left;  Surgeon: Newt Minion, MD;  Location: [place];  Service: Orthopedics;  Laterality: Left;  Left Total Hip Arthroplasty   Social History   Occupational History  . Not on file  Tobacco Use  . Smoking status: Never Smoker  . Smokeless tobacco: Former Systems developer    Types: Chew  Substance and Sexual  Activity  . Alcohol use: Yes  . Drug use: No  . Sexual activity: Not on file

## 2017-11-02 ENCOUNTER — Encounter (INDEPENDENT_AMBULATORY_CARE_PROVIDER_SITE_OTHER): Payer: Self-pay | Admitting: Orthopedic Surgery

## 2017-11-02 ENCOUNTER — Ambulatory Visit (INDEPENDENT_AMBULATORY_CARE_PROVIDER_SITE_OTHER): Payer: 59 | Admitting: Orthopedic Surgery

## 2017-11-02 DIAGNOSIS — M5410 Radiculopathy, site unspecified: Secondary | ICD-10-CM

## 2017-11-02 DIAGNOSIS — M1611 Unilateral primary osteoarthritis, right hip: Secondary | ICD-10-CM

## 2017-11-02 NOTE — Progress Notes (Signed)
Office Visit Note   Patient: James Whitehead           Date of Birth: 1956/04/26           MRN: [id number] Visit Date: 11/02/2017              Requested by: [name], [address], [place] [address] PCP: [name], MD  No chief complaint on file.     HPI: Patient is a 62 year old gentleman who presents with osteoarthritis of his right hip as well as radicular pain from his lumbar spine.  Patient states that the prednisone 10 mg a day relieved approximately 75% of his radicular symptoms now he only has lower back pain.  Patient states he has been having increasing pain in the right hip causing him to limp and putting increased pressure on his left lower extremity.  He states he feels like he has muscle sprains in his left leg.  Assessment & Plan: Visit Diagnoses:  1. Unilateral primary osteoarthritis, right hip   2. Radicular pain of both lower extremities     Plan: Discussed that would recommend following up with Dr. Ninfa Linden for a right total hip arthroplasty.  He will wean off the prednisone as instructed and follow-up as needed.  If he has any recurrent lumbar radicular symptoms we could set him up for an MRI scan for evaluation for an epidural steroid injection.  Follow-Up Instructions: Return if symptoms worsen or fail to improve.   Ortho Exam  Patient is alert, oriented, no adenopathy, well-dressed, normal affect, normal respiratory effort. Examination patient does have a antalgic gait with an abductor lurch on the right.  He has pain with internal and external rotation of the right hip he has a negative straight leg raise bilaterally and no focal motor weakness of either lower extremity.  Imaging: No results found. No images are attached to the encounter.  Labs: No results found for: HGBA1C, ESRSEDRATE, CRP, LABURIC, REPTSTATUS, GRAMSTAIN, CULT, LABORGA   Lab Results  Component Value Date   ALBUMIN 4.3 12/20/2012    There is no height  or weight on file to calculate BMI.  Orders:  No orders of the defined types were placed in this encounter.  No orders of the defined types were placed in this encounter.    Procedures: No procedures performed  Clinical Data: No additional findings.  ROS:  All other systems negative, except as noted in the HPI. Review of Systems  Objective: Vital Signs: There were no vitals taken for this visit.  Specialty Comments:  No specialty comments available.  PMFS History: Patient Active Problem List   Diagnosis Date Noted  . Pain in finger of right hand 07/15/2016  . Avascular necrosis of bone of left hip ([place]) 12/29/2012   Past Medical History:  Diagnosis Date  . Arthritis   . Hypertension   . Left hydrocele   . Spermatocele    left    History reviewed. No pertinent family history.  Past Surgical History:  Procedure Laterality Date  . HYDROCELE EXCISION Left 03/01/2015   Procedure: HYDROCELECTOMY ADULT;  Surgeon: Alexis Frock, MD;  [facility];  Service: Urology;  Laterality: Left;  . INGUINAL HERNIA REPAIR Bilateral 2006  . ORCHIOPEXY Left 03/01/2015   Procedure: ORCHIOPEXY ADULT;  Surgeon: Alexis Frock, MD;  [facility];  Service: Urology;  Laterality: Left;  . TONSILLECTOMY  as child  . TOTAL HIP ARTHROPLASTY Left 12/28/2012  Procedure: TOTAL HIP ARTHROPLASTY- left;  Surgeon: Newt Minion, MD;  Location: [place];  Service: Orthopedics;  Laterality: Left;  Left Total Hip Arthroplasty   Social History   Occupational History  . Not on file  Tobacco Use  . Smoking status: Never Smoker  . Smokeless tobacco: Former Systems developer    Types: Chew  Substance and Sexual Activity  . Alcohol use: Yes  . Drug use: No  . Sexual activity: Not on file

## 2017-12-08 DIAGNOSIS — R609 Edema, unspecified: Secondary | ICD-10-CM | POA: Diagnosis not present

## 2017-12-08 DIAGNOSIS — E7800 Pure hypercholesterolemia, unspecified: Secondary | ICD-10-CM | POA: Diagnosis not present

## 2017-12-08 DIAGNOSIS — I10 Essential (primary) hypertension: Secondary | ICD-10-CM | POA: Diagnosis not present

## 2017-12-27 ENCOUNTER — Encounter (INDEPENDENT_AMBULATORY_CARE_PROVIDER_SITE_OTHER): Payer: Self-pay | Admitting: Orthopedic Surgery

## 2017-12-27 ENCOUNTER — Ambulatory Visit (INDEPENDENT_AMBULATORY_CARE_PROVIDER_SITE_OTHER): Payer: 59 | Admitting: Orthopedic Surgery

## 2017-12-27 DIAGNOSIS — M545 Low back pain: Secondary | ICD-10-CM | POA: Diagnosis not present

## 2017-12-27 DIAGNOSIS — M79604 Pain in right leg: Secondary | ICD-10-CM

## 2017-12-27 NOTE — Progress Notes (Signed)
Office Visit Note   Patient: James Whitehead           Date of Birth: Apr 05, 1956           MRN: [id number] Visit Date: 12/27/2017              Requested by: [name], [address] [place], [place] [address] PCP: [name], MD  No chief complaint on file.     HPI: Patient is a 62 year old gentleman who complains of radicular pain down the both lower extremities left worse than right.  Patient states the prednisone did not help.  Patient complains of numbness and fullness on the plantar aspect of both feet.  Assessment & Plan: Visit Diagnoses: No diagnosis found.  Plan: We will try to set patient up for an MRI scan.  He states he has bad insurance we will need to get the dollar amount finalized for insurance prior to him proceeding with the MRI scan.  Anticipate epidural steroid injections for his radicular symptoms.  Follow-Up Instructions: No follow-ups on file.   Ortho Exam  Patient is alert, oriented, no adenopathy, well-dressed, normal affect, normal respiratory effort. Examination patient has no pain with range of motion of the hip knee or ankle he has negative straight leg raise bilaterally and no focal motor weakness in either lower extremity.  Imaging: No results found. No images are attached to the encounter.  Labs: No results found for: HGBA1C, ESRSEDRATE, CRP, LABURIC, REPTSTATUS, GRAMSTAIN, CULT, LABORGA   Lab Results  Component Value Date   ALBUMIN 4.3 12/20/2012    There is no height or weight on file to calculate BMI.  Orders:  No orders of the defined types were placed in this encounter.  No orders of the defined types were placed in this encounter.    Procedures: No procedures performed  Clinical Data: No additional findings.  ROS:  All other systems negative, except as noted in the HPI. Review of Systems  Objective: Vital Signs: There were no vitals taken for this visit.  Specialty Comments:  No specialty comments  available.  PMFS History: Patient Active Problem List   Diagnosis Date Noted  . Pain in finger of right hand 07/15/2016  . Avascular necrosis of bone of left hip ([place]) 12/29/2012   Past Medical History:  Diagnosis Date  . Arthritis   . Hypertension   . Left hydrocele   . Spermatocele    left    No family history on file.  Past Surgical History:  Procedure Laterality Date  . HYDROCELE EXCISION Left 03/01/2015   Procedure: HYDROCELECTOMY ADULT;  Surgeon: Alexis Frock, MD;  Location: [facility];  Service: Urology;  Laterality: Left;  . INGUINAL HERNIA REPAIR Bilateral 2006  . ORCHIOPEXY Left 03/01/2015   Procedure: ORCHIOPEXY ADULT;  Surgeon: Alexis Frock, MD;  Location: [facility];  Service: Urology;  Laterality: Left;  . TONSILLECTOMY  as child  . TOTAL HIP ARTHROPLASTY Left 12/28/2012   Procedure: TOTAL HIP ARTHROPLASTY- left;  Surgeon: Newt Minion, MD;  Location: [place];  Service: Orthopedics;  Laterality: Left;  Left Total Hip Arthroplasty   Social History   Occupational History  . Not on file  Tobacco Use  . Smoking status: Never Smoker  . Smokeless tobacco: Former Systems developer    Types: Chew  Substance and Sexual Activity  . Alcohol use: Yes  . Drug use: No  . Sexual activity: Not on file

## 2018-01-07 DIAGNOSIS — M545 Low back pain: Secondary | ICD-10-CM | POA: Diagnosis not present

## 2018-01-17 ENCOUNTER — Encounter (INDEPENDENT_AMBULATORY_CARE_PROVIDER_SITE_OTHER): Payer: Self-pay | Admitting: Orthopedic Surgery

## 2018-01-17 ENCOUNTER — Other Ambulatory Visit (INDEPENDENT_AMBULATORY_CARE_PROVIDER_SITE_OTHER): Payer: Self-pay

## 2018-01-17 ENCOUNTER — Ambulatory Visit (INDEPENDENT_AMBULATORY_CARE_PROVIDER_SITE_OTHER): Payer: 59 | Admitting: Orthopedic Surgery

## 2018-01-17 DIAGNOSIS — M79604 Pain in right leg: Secondary | ICD-10-CM

## 2018-01-17 DIAGNOSIS — M545 Low back pain: Secondary | ICD-10-CM

## 2018-01-17 DIAGNOSIS — M79605 Pain in left leg: Secondary | ICD-10-CM

## 2018-01-17 NOTE — Progress Notes (Signed)
Patient is having pain in his back he has had good results with epidural steroid injections in the past and we will have him follow-up with Dr. Ernestina Patches for injections.  Patient was not examined.

## 2018-01-17 NOTE — Progress Notes (Signed)
Patient has been followed by Dr Sharol Given for his back and hip pain. He was referred to Dr Ninfa Linden for further eval of his hip pain. After failed response to prednisone for his back/radicular leg pain, Dr Sharol Given recommended MRI scan with ESI to follow. Due to patients insurance he was going to look into it and then call if decided to proceed. Patient did pursue MRI. He stated he called our office and spoke with receptionist and advised that he had already had scan and needed to schedule an appt to get injection done in his back.  However, appt was scheduled with Dr Sharol Given rather than referral being made to have this done by Dr Ernestina Patches. Patient came in today for an appointment for what he thought was going to be a lumbar ESI. Per Dr Sharol Given, appt cancelled, his $150 copay was refunded and I put referral in for patient to get ESI by Dr Ernestina Patches for his back. Patient verbalized understanding that he would be contacted to be scheduled for injection.  Just wanted to make sure that you were aware of what was going on in case it was mentioned when you called patient to schedule appt for ESI. I put MRI disc on Courtney's desk.

## 2018-01-19 ENCOUNTER — Encounter (INDEPENDENT_AMBULATORY_CARE_PROVIDER_SITE_OTHER): Payer: Self-pay | Admitting: Orthopaedic Surgery

## 2018-01-19 ENCOUNTER — Ambulatory Visit (INDEPENDENT_AMBULATORY_CARE_PROVIDER_SITE_OTHER): Payer: 59 | Admitting: Orthopaedic Surgery

## 2018-01-19 ENCOUNTER — Ambulatory Visit (INDEPENDENT_AMBULATORY_CARE_PROVIDER_SITE_OTHER): Payer: Self-pay

## 2018-01-19 DIAGNOSIS — M1611 Unilateral primary osteoarthritis, right hip: Secondary | ICD-10-CM

## 2018-01-19 DIAGNOSIS — M25551 Pain in right hip: Secondary | ICD-10-CM | POA: Diagnosis not present

## 2018-01-19 NOTE — Progress Notes (Signed)
Office Visit Note   Patient: James Whitehead           Date of Birth: 1955/09/15           MRN: [id number] Visit Date: 01/19/2018              Requested by: [name], [address] [place] [place], [place] [address] PCP: [name], MD   Assessment & Plan: Visit Diagnoses:  1. Pain in right hip   2. Unilateral primary osteoarthritis, right hip     Plan: He does have significant arthritis in his right hip on x-ray.  I showed him this as well.  However his clinical exam is showing only minimal pain today and he would like to hold off on any intervention until he has something done or intervention with his lumbar spine.  With that being said then I will see him back in 4 weeks to see how is doing overall.  I did give him a handout on direct anterior hip replacement surgery and describe what this involves.  We will see how is doing 4 weeks from now.  All question concerns were answered and addressed.  Follow-Up Instructions: Return in about 4 weeks (around 02/16/2018).   Orders:  Orders Placed This Encounter  Procedures  . XR HIP UNILAT W OR W/O PELVIS 1V RIGHT   No orders of the defined types were placed in this encounter.     Procedures: No procedures performed   Clinical Data: No additional findings.   Subjective: Chief Complaint  Patient presents with  . Right Hip - Pain  The patient is referred to me by Dr. Sharol Given to evaluate and treat right hip pain and arthritis.  He actually has a left total hip arthroplasty done 5 years ago by Dr. Sharol Given.  He also has significant lumbar spine issues and according to the patient, he is supposed to see Dr. Ernestina Patches in the near future for intervention his lumbar spine.  Apparently an MRI had already been obtained and that is been dropped off to Dr. Sharol Given and apparently potentially to Dr. Ernestina Patches.  As far as his right hip goes he said he is having a good day and is not really been hurting him much.  He complains more of pain in his back  and pain going down his left leg.  He does report some right hip and groin pain.  HPI  Review of Systems He currently denies any headache or chest pain.  Denies any shortness of breath.  Denies any fever and chills.  Objective: Vital Signs: There were no vitals taken for this visit.  Physical Exam He is alert and oriented x3 and in no acute distress or obvious discomfort. Ortho Exam Examination of his left and right hips were performed shows no significant pain with internal extra rotation of either hip.  If anything his pain is very mild surprisingly. Specialty Comments:  No specialty comments available.  Imaging: Xr Hip Unilat W Or W/o Pelvis 1v Right  Result Date: 01/19/2018 An AP pelvis and lateral of the right hip shows severe and significant end-stage arthritis of the right hip.  There is complete loss of the superior lateral joint space with para-articular osteophytes and sclerotic changes in the acetabulum and femoral head.    PMFS History: Patient Active Problem List   Diagnosis Date Noted  . Pain in finger of right hand 07/15/2016  . Avascular necrosis of bone of left hip ([place]) 12/29/2012  Past Medical History:  Diagnosis Date  . Arthritis   . Hypertension   . Left hydrocele   . Spermatocele    left    No family history on file.  Past Surgical History:  Procedure Laterality Date  . HYDROCELE EXCISION Left 03/01/2015   Procedure: HYDROCELECTOMY ADULT;  Surgeon: Alexis Frock, MD;  Location: [facility];  Service: Urology;  Laterality: Left;  . INGUINAL HERNIA REPAIR Bilateral 2006  . ORCHIOPEXY Left 03/01/2015   Procedure: ORCHIOPEXY ADULT;  Surgeon: Alexis Frock, MD;  Location: [facility];  Service: Urology;  Laterality: Left;  . TONSILLECTOMY  as child  . TOTAL HIP ARTHROPLASTY Left 12/28/2012   Procedure: TOTAL HIP ARTHROPLASTY- left;  Surgeon: Newt Minion, MD;  Location: [place] [facility];  Service: Orthopedics;  Laterality: Left;   Left Total Hip Arthroplasty   Social History   Occupational History  . Not on file  Tobacco Use  . Smoking status: Never Smoker  . Smokeless tobacco: Former Systems developer    Types: Chew  Substance and Sexual Activity  . Alcohol use: Yes  . Drug use: No  . Sexual activity: Not on file

## 2018-02-11 ENCOUNTER — Ambulatory Visit (INDEPENDENT_AMBULATORY_CARE_PROVIDER_SITE_OTHER): Payer: Self-pay

## 2018-02-11 ENCOUNTER — Ambulatory Visit (INDEPENDENT_AMBULATORY_CARE_PROVIDER_SITE_OTHER): Payer: 59 | Admitting: Physical Medicine and Rehabilitation

## 2018-02-11 VITALS — BP 133/64 | HR 71

## 2018-02-11 DIAGNOSIS — M5416 Radiculopathy, lumbar region: Secondary | ICD-10-CM

## 2018-02-11 DIAGNOSIS — D1779 Benign lipomatous neoplasm of other sites: Secondary | ICD-10-CM | POA: Diagnosis not present

## 2018-02-11 DIAGNOSIS — M48062 Spinal stenosis, lumbar region with neurogenic claudication: Secondary | ICD-10-CM | POA: Diagnosis not present

## 2018-02-11 MED ORDER — BETAMETHASONE SOD PHOS & ACET 6 (3-3) MG/ML IJ SUSP
12.0000 mg | Freq: Once | INTRAMUSCULAR | Status: AC
  Administered 2018-02-11: 12 mg

## 2018-02-11 NOTE — Patient Instructions (Signed)

## 2018-02-11 NOTE — Progress Notes (Signed)
Numeric Pain Rating Scale and Functional Assessment Average Pain 9   In the last MONTH (on 0-10 scale) has pain interfered with the following?  1. General activity like being  able to carry out your everyday physical activities such as walking, climbing stairs, carrying groceries, or moving a chair?  Rating(6)    +Driver, -BT, -Dye Allergies. 

## 2018-02-17 ENCOUNTER — Encounter (INDEPENDENT_AMBULATORY_CARE_PROVIDER_SITE_OTHER): Payer: Self-pay | Admitting: Orthopaedic Surgery

## 2018-02-17 ENCOUNTER — Ambulatory Visit (INDEPENDENT_AMBULATORY_CARE_PROVIDER_SITE_OTHER): Payer: 59 | Admitting: Orthopaedic Surgery

## 2018-02-17 DIAGNOSIS — M1611 Unilateral primary osteoarthritis, right hip: Secondary | ICD-10-CM | POA: Diagnosis not present

## 2018-02-17 DIAGNOSIS — M7062 Trochanteric bursitis, left hip: Secondary | ICD-10-CM

## 2018-02-17 MED ORDER — LIDOCAINE HCL 1 % IJ SOLN
3.0000 mL | INTRAMUSCULAR | Status: AC | PRN
  Administered 2018-02-17: 3 mL

## 2018-02-17 MED ORDER — METHYLPREDNISOLONE ACETATE 40 MG/ML IJ SUSP
40.0000 mg | INTRAMUSCULAR | Status: AC | PRN
  Administered 2018-02-17: 40 mg via INTRA_ARTICULAR

## 2018-02-17 NOTE — Progress Notes (Signed)
Office Visit Note   Patient: James Whitehead           Date of Birth: 1955/09/07           MRN: [id number] Visit Date: 02/17/2018              Requested by: [name], MD [address] [place], [place] [address] PCP: [name], MD   Assessment & Plan: Visit Diagnoses:  1. Unilateral primary osteoarthritis, right hip   2. Trochanteric bursitis, left hip     Plan: At this point I did provide a steroid injection in his painful trochanteric area on the left side.  As far as his right hip goes he does feel that he would like to proceed with a total hip replacement.  We again discussed what this involves from an anterior standpoint.  We talked about the risk and benefits of surgery and was intraoperative and postoperative course were involved.  We will work on getting this scheduled in the near future per his wishes.  We will give him a call about this.  All question concerns were answered and addressed.  Follow-Up Instructions: Return for 2 weeks post-op.   Orders:  Orders Placed This Encounter  Procedures  . Large Joint Inj   No orders of the defined types were placed in this encounter.     Procedures: Large Joint Inj: L greater trochanter on 02/17/2018 4:33 PM Indications: pain and diagnostic evaluation Details: 22 G 1.5 in needle, lateral approach  Arthrogram: No  Medications: 3 mL lidocaine 1 %; 40 mg methylPREDNISolone acetate 40 MG/ML Outcome: tolerated well, no immediate complications Procedure, treatment alternatives, risks and benefits explained, specific risks discussed. Consent was given by the patient. Immediately prior to procedure a time out was called to verify the correct patient, procedure, equipment, support staff and site/side marked as required. Patient was prepped and draped in the usual sterile fashion.       Clinical Data: No additional findings.   Subjective: Chief Complaint  Patient presents with  . Right Hip - Follow-up  Patient  is here again today to talk about his right hip.  He has known end-stage arthritis of the right hip.  On occasion it hurts him quite a bit with walking but other times it does not hurt.  However he is been developing worsening bursitis in his left hip.  He has a previous left hip replacement done by Dr. Sharol Given.  He is also had issues with his lumbar spine.  He would like to have a trochanteric injection on the left side today.  He did review my handout that I gave him on hip replacement surgery and he wants to talk about that more today as well.  Is gotten to be where his pain is daily and is detrimentally affect is active daily living, his quality of life, his mobility.  He is very active 62 year old gentleman.  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  He has had injections already in his lumbar spine as well.  HPI  Review of Systems He currently denies any headache, chest pain, shortness of breath, fever, chills, nausea, vomiting.  Objective: Vital Signs: There were no vitals taken for this visit.  Physical Exam Is alert and oriented x3 and in no acute distress Ortho Exam Examination of his left hip shows pain only of the trochanteric area with full range of motion of both hips no pain in the groin.  Examination of his right  hip shows stiffness with internal and external rotation and pain in the groin with internal and external rotation. Specialty Comments:  No specialty comments available.  Imaging: No results found. Previous x-rays of his pelvis show any left total hip arthroplasty and severe end-stage arthritis of the right hip.  PMFS History: Patient Active Problem List   Diagnosis Date Noted  . Pain in finger of right hand 07/15/2016  . Avascular necrosis of bone of left hip ([place]) 12/29/2012   Past Medical History:  Diagnosis Date  . Arthritis   . Hypertension   . Left hydrocele   . Spermatocele    left    History reviewed. No pertinent family history.  Past Surgical  History:  Procedure Laterality Date  . HYDROCELE EXCISION Left 03/01/2015   Procedure: HYDROCELECTOMY ADULT;  Surgeon: Alexis Frock, MD;  Location: [facility];  Service: Urology;  Laterality: Left;  . INGUINAL HERNIA REPAIR Bilateral 2006  . ORCHIOPEXY Left 03/01/2015   Procedure: ORCHIOPEXY ADULT;  Surgeon: Alexis Frock, MD;  Location: [facility];  Service: Urology;  Laterality: Left;  . TONSILLECTOMY  as child  . TOTAL HIP ARTHROPLASTY Left 12/28/2012   Procedure: TOTAL HIP ARTHROPLASTY- left;  Surgeon: Newt Minion, MD;  Location: [place];  Service: Orthopedics;  Laterality: Left;  Left Total Hip Arthroplasty   Social History   Occupational History  . Not on file  Tobacco Use  . Smoking status: Never Smoker  . Smokeless tobacco: Former Systems developer    Types: Chew  Substance and Sexual Activity  . Alcohol use: Yes  . Drug use: No  . Sexual activity: Not on file

## 2018-02-24 NOTE — Progress Notes (Signed)
James Whitehead - 62 y.o. male MRN [id number]  Date of birth: April 18, 1956  Office Visit Note: Visit Date: 02/11/2018 PCP: [name], MD Referred by: [name], MD  Subjective: Chief Complaint  Patient presents with  . Lower Back - Pain   HPI: James Whitehead is a 62 year old gentleman followed both by Dr. Meridee Score as well as Dr. Jean Rosenthal in our office.  He has been having issues with pain in the low back and bilateral hips and legs.  He has had a history of left total hip arthroplasty by Dr. Sharol Given.  This was performed by Dr. Sharol Given 2014.  This was Sharol Given avascular necrosis of the left hip.  He now has significant right sided arthritis of the right hip.  He has pain in the low back and down on the left hip and leg more than right.  He has pain in the right groin.  MRI of the lumbar spine was completed after he failed conservative and nonconservative care.  He has disc herniation more on the right at L4-5 with some narrowing of the canal did epidural lipomatosis.  Multilevel facet arthropathy.  At L5-S1 there is significant lumbar epidural lipomatosis which really is crowding the entire canal.  This may in fact give him some pain just like stenosis otherwise would do.  Unfortunately with lipomatosis injections usually not very beneficial but can basically think it is wise to go ahead and complete an injection today for him.  We will complete a bilateral L4 transforaminal injection.  He has a somewhat lumbarized S1 segment.   ROS Otherwise per HPI.  Assessment & Plan: Visit Diagnoses:  1. Lumbar radiculopathy   2. Spinal stenosis of lumbar region with neurogenic claudication   3. Epidural lipomatosis     Plan: No additional findings.   Meds & Orders:  Meds ordered this encounter  Medications  . betamethasone acetate-betamethasone sodium phosphate (CELESTONE) injection 12 mg    Orders Placed This Encounter  Procedures  . XR C-ARM NO REPORT  . Epidural Steroid injection      Follow-up: Return if symptoms worsen or fail to improve.   Procedures: No procedures performed  Lumbosacral Transforaminal Epidural Steroid Injection - Sub-Pedicular Approach with Fluoroscopic Guidance  Patient: James Whitehead      Date of Birth: 1956-03-19 MRN: [id number] PCP: [name], MD      Visit Date: 02/11/2018   Universal Protocol:    Date/Time: 02/11/2018  Consent Given By: the patient  Position: PRONE  Additional Comments: Vital signs were monitored before and after the procedure. Patient was prepped and draped in the usual sterile fashion. The correct patient, procedure, and site was verified.   Injection Procedure Details:  Procedure Site One Meds Administered:  Meds ordered this encounter  Medications  . betamethasone acetate-betamethasone sodium phosphate (CELESTONE) injection 12 mg    Laterality: Bilateral  Location/Site:  L4-L5  Needle size: 22 G  Needle type: Spinal  Needle Placement: Transforaminal  Findings:    -Comments: Excellent flow of contrast along the nerve and into the epidural space.  Procedure Details: After squaring off the end-plates to get a true AP view, the C-arm was positioned so that an oblique view of the foramen as noted above was visualized. The target area is just inferior to the "nose of the scotty dog" or sub pedicular. The soft tissues overlying this structure were infiltrated with 2-3 ml. of 1% Lidocaine without Epinephrine.  The spinal needle was inserted toward the target  using a "trajectory" view along the fluoroscope beam.  Under AP and lateral visualization, the needle was advanced so it did not puncture dura and was located close the 6 O'Clock position of the pedical in AP tracterory. Biplanar projections were used to confirm position. Aspiration was confirmed to be negative for CSF and/or blood. A 1-2 ml. volume of Isovue-250 was injected and flow of contrast was noted at each level. Radiographs were obtained for  documentation purposes.   After attaining the desired flow of contrast documented above, a 0.5 to 1.0 ml test dose of 0.25% Marcaine was injected into each respective transforaminal space.  The patient was observed for 90 seconds post injection.  After no sensory deficits were reported, and normal lower extremity motor function was noted,   the above injectate was administered so that equal amounts of the injectate were placed at each foramen (level) into the transforaminal epidural space.   Additional Comments:  The patient tolerated the procedure well Dressing: Band-Aid    Post-procedure details: Patient was observed during the procedure. Post-procedure instructions were reviewed.  Patient left the clinic in stable condition.    Clinical History: MRI LUMBAR SPINE WITHOUT CONTRAST  TECHNIQUE: Multiplanar, multisequence MR imaging of the lumbar spine was performed. No intravenous contrast was administered.  COMPARISON: None.  FINDINGS: Segmentation: Rudimentary disc space at S1-2 based on the lowest ribs.  Alignment: Mild L4-5 and L5-S1 retrolisthesis.  Vertebrae: Endplate edematous signal at L3-4, L4-5, and to the greatest degree at L5-S1, degenerative appearing. No fracture or aggressive bone lesion. Schmorl's nodes present at the L4 superior and inferior endplates.  Conus medullaris and cauda equina: Conus extends to the L1-2 level. Conus and cauda equina appear normal.  Paraspinal and other soft tissues: Negative  Disc levels:  T12- L1: Unremarkable.  L1-L2: Disc narrowing and desiccation with circumferential bulging. No impingement  L2-L3: Disc narrowing and circumferential bulging. No impingement  L3-L4: Disc narrowing and bulging with a right paracentral inferiorly migrating extrusion. Inferior migration best seen on sagittal images. There is impingement on the right L4 nerve root. Spinal stenosis is overall mild.  L4-L5: Disc narrowing and endplate  degeneration with circumferential bulging. There is a central downward pointing disc protrusion. Dorsal epidural fat expansion. Moderate narrowing of the thecal sac. Noncompressive right foraminal narrowing. An early synovial cyst is seen along the deep surface of the right facet, measuring up to 4 mm.  L5-S1:The epidural fat completely effaces the thecal sac. Disc narrowing and bulging with left foraminal impingement. The left L5 nerve root is distorted on sagittal images. Moderate right foraminal narrowing.  IMPRESSION: 1. L3-4 right paracentral extrusion impinging on the right L4 nerve root. 2. L4-5 moderate spinal stenosis from bulge, protrusion and epidural fat expansion. 3. L5-S1 complete thecal sac effacement from epidural fat. There is degenerative foraminal stenosis that is advanced on the left and moderate on the right. 4. Endplate edematous signal at L3-4, L4-5, and L5-S1 attributed to degeneration.   Electronically Signed By: Monte Fantasia M.D. On: 01/08/2018 09:13   He reports that he has never smoked. He quit smokeless tobacco use about 41 years ago.  His smokeless tobacco use included chew. No results for input(s): HGBA1C, LABURIC in the last 8760 hours.  Objective:  VS:  HT:    WT:   BMI:     BP:133/64  HR:71bpm  TEMP: ( )  RESP:  Physical Exam  Ortho Exam Imaging: No results found.  Past Medical/Family/Surgical/Social History: Medications & Allergies reviewed per  EMR, new medications updated. Patient Active Problem List   Diagnosis Date Noted  . Pain in finger of right hand 07/15/2016  . Avascular necrosis of bone of left hip ([place]) 12/29/2012   Past Medical History:  Diagnosis Date  . Arthritis   . Hypertension   . Left hydrocele   . Spermatocele    left   No family history on file. Past Surgical History:  Procedure Laterality Date  . HYDROCELE EXCISION Left 03/01/2015   Procedure: HYDROCELECTOMY ADULT;  Surgeon: Alexis Frock, MD;   Location: [facility];  Service: Urology;  Laterality: Left;  . INGUINAL HERNIA REPAIR Bilateral 2006  . ORCHIOPEXY Left 03/01/2015   Procedure: ORCHIOPEXY ADULT;  Surgeon: Alexis Frock, MD;  Location: [facility];  Service: Urology;  Laterality: Left;  . TONSILLECTOMY  as child  . TOTAL HIP ARTHROPLASTY Left 12/28/2012   Procedure: TOTAL HIP ARTHROPLASTY- left;  Surgeon: Newt Minion, MD;  Location: [place];  Service: Orthopedics;  Laterality: Left;  Left Total Hip Arthroplasty   Social History   Occupational History  . Not on file  Tobacco Use  . Smoking status: Never Smoker  . Smokeless tobacco: Former Systems developer    Types: Chew  Substance and Sexual Activity  . Alcohol use: Yes  . Drug use: No  . Sexual activity: Not on file

## 2018-02-24 NOTE — Procedures (Signed)
Lumbosacral Transforaminal Epidural Steroid Injection - Sub-Pedicular Approach with Fluoroscopic Guidance  Patient: James Whitehead      Date of Birth: 1956/02/12 MRN: [id number] PCP: [name], MD      Visit Date: 02/11/2018   Universal Protocol:    Date/Time: 02/11/2018  Consent Given By: the patient  Position: PRONE  Additional Comments: Vital signs were monitored before and after the procedure. Patient was prepped and draped in the usual sterile fashion. The correct patient, procedure, and site was verified.   Injection Procedure Details:  Procedure Site One Meds Administered:  Meds ordered this encounter  Medications  . betamethasone acetate-betamethasone sodium phosphate (CELESTONE) injection 12 mg    Laterality: Bilateral  Location/Site:  L4-L5  Needle size: 22 G  Needle type: Spinal  Needle Placement: Transforaminal  Findings:    -Comments: Excellent flow of contrast along the nerve and into the epidural space.  Procedure Details: After squaring off the end-plates to get a true AP view, the C-arm was positioned so that an oblique view of the foramen as noted above was visualized. The target area is just inferior to the "nose of the scotty dog" or sub pedicular. The soft tissues overlying this structure were infiltrated with 2-3 ml. of 1% Lidocaine without Epinephrine.  The spinal needle was inserted toward the target using a "trajectory" view along the fluoroscope beam.  Under AP and lateral visualization, the needle was advanced so it did not puncture dura and was located close the 6 O'Clock position of the pedical in AP tracterory. Biplanar projections were used to confirm position. Aspiration was confirmed to be negative for CSF and/or blood. A 1-2 ml. volume of Isovue-250 was injected and flow of contrast was noted at each level. Radiographs were obtained for documentation purposes.   After attaining the desired flow of contrast documented above, a 0.5 to  1.0 ml test dose of 0.25% Marcaine was injected into each respective transforaminal space.  The patient was observed for 90 seconds post injection.  After no sensory deficits were reported, and normal lower extremity motor function was noted,   the above injectate was administered so that equal amounts of the injectate were placed at each foramen (level) into the transforaminal epidural space.   Additional Comments:  The patient tolerated the procedure well Dressing: Band-Aid    Post-procedure details: Patient was observed during the procedure. Post-procedure instructions were reviewed.  Patient left the clinic in stable condition.

## 2018-03-01 ENCOUNTER — Telehealth (INDEPENDENT_AMBULATORY_CARE_PROVIDER_SITE_OTHER): Payer: Self-pay | Admitting: Orthopaedic Surgery

## 2018-03-01 NOTE — Telephone Encounter (Signed)
Please advise. Thanks.  

## 2018-03-01 NOTE — Telephone Encounter (Signed)
IC s/w patient and advised per Dr Rondel Baton understanding.

## 2018-03-01 NOTE — Telephone Encounter (Signed)
That is something that needs to be prescribed by a primary care physician.  "Fluid pills" are actually blood pressure meds as well.

## 2018-03-01 NOTE — Telephone Encounter (Signed)
Patient wants to know if Dr Ninfa Linden will prescribe him a fluid pill to see if it will help with the swelling in his feet. Pt's callback # [phone]

## 2018-03-29 ENCOUNTER — Other Ambulatory Visit (INDEPENDENT_AMBULATORY_CARE_PROVIDER_SITE_OTHER): Payer: Self-pay | Admitting: Physician Assistant

## 2018-03-30 ENCOUNTER — Other Ambulatory Visit (INDEPENDENT_AMBULATORY_CARE_PROVIDER_SITE_OTHER): Payer: Self-pay

## 2018-04-01 ENCOUNTER — Other Ambulatory Visit (HOSPITAL_COMMUNITY): Payer: Self-pay

## 2018-04-14 ENCOUNTER — Other Ambulatory Visit: Payer: Self-pay | Admitting: Podiatry

## 2018-04-14 ENCOUNTER — Encounter: Payer: Self-pay | Admitting: Podiatry

## 2018-04-14 ENCOUNTER — Ambulatory Visit (INDEPENDENT_AMBULATORY_CARE_PROVIDER_SITE_OTHER): Payer: 59

## 2018-04-14 ENCOUNTER — Ambulatory Visit: Payer: 59 | Admitting: Podiatry

## 2018-04-14 DIAGNOSIS — M79672 Pain in left foot: Secondary | ICD-10-CM

## 2018-04-14 DIAGNOSIS — M778 Other enthesopathies, not elsewhere classified: Secondary | ICD-10-CM

## 2018-04-14 DIAGNOSIS — M779 Enthesopathy, unspecified: Secondary | ICD-10-CM

## 2018-04-14 DIAGNOSIS — M79671 Pain in right foot: Secondary | ICD-10-CM

## 2018-04-14 NOTE — Progress Notes (Signed)
Subjective:   Patient ID: James Whitehead, male   DOB: 62 y.o.   MRN: [id number]   HPI Patient presents stating his had some swelling in his ankles and does not remember reason why but he was quite active and it seemed like it occurred after the activity.  Patient does not smoke and is relatively active in general   Review of Systems  All other systems reviewed and are negative.       Objective:  Physical Exam  Constitutional: He appears well-developed and well-nourished.  Cardiovascular: Intact distal pulses.  Pulmonary/Chest: Effort normal.  Musculoskeletal: Normal range of motion.  Neurological: He is alert.  Skin: Skin is warm.  Nursing note and vitals reviewed.   Neurovascular status was found to be intact muscle strength was adequate patient found to have mild swelling of the ankle region bilateral with negative Homans sign noted and no pitting edema noted.  There is mild discomfort associated with this but no range of motion loss or crepitus     Assessment:  Appears to be an inflammatory condition with possibility for low-grade venous disease     Plan:  H&P conditions reviewed x-rays reviewed.  Do not recommend any kind of fluid pill as there is nothing pitting and I do think it is more related probably low-grade vascular disease and venous disease.  Today I did apply ankle compression stockings bilateral to try to compress and to push the fluid out of the area and advised on elevation at night.  Patient will be seen back for [place] to recheck  X-ray indicates no indications of arthritis or other pathology

## 2018-06-28 ENCOUNTER — Other Ambulatory Visit (INDEPENDENT_AMBULATORY_CARE_PROVIDER_SITE_OTHER): Payer: Self-pay | Admitting: Physician Assistant

## 2018-06-29 ENCOUNTER — Other Ambulatory Visit (INDEPENDENT_AMBULATORY_CARE_PROVIDER_SITE_OTHER): Payer: Self-pay

## 2018-07-04 ENCOUNTER — Telehealth (INDEPENDENT_AMBULATORY_CARE_PROVIDER_SITE_OTHER): Payer: Self-pay

## 2018-07-04 NOTE — Telephone Encounter (Signed)
Patient called wanting to know if he should be concerned about his feet being swollen.  Stated that his feet have been swollen for a while.  Patient is scheduled for Right [facility]A on Friday, 07/08/2018.  Cb# is [phone] or [phone].  Please advise.  Thank you.

## 2018-07-04 NOTE — Telephone Encounter (Signed)
The swelling should not affect surgery.

## 2018-07-04 NOTE — Telephone Encounter (Signed)
See below

## 2018-07-04 NOTE — Telephone Encounter (Signed)
Patient just wanted to let you know this is happening He states he has been wearing compression socks for over a year, he just wanted to make sure this swelling won't effect him and surgery

## 2018-07-04 NOTE — Telephone Encounter (Signed)
Swelling can be common due to arthritis and inactivity.  He should try elevation as well as trying some compressive socks on both his lower extremities.

## 2018-07-05 NOTE — Patient Instructions (Signed)
James Whitehead  07/05/2018   Your procedure is scheduled on: 07-08-18  Report to [facility] Main  Entrance  Report to admitting at                         0830  AM    Call this number if you have problems the morning of surgery [phone]    Remember: Do not eat food or drink liquids :After Midnight.  BRUSH YOUR TEETH MORNING OF SURGERY AND RINSE YOUR MOUTH OUT, NO CHEWING GUM CANDY OR MINTS.     Take these medicines the morning of surgery with A SIP OF WATER: coreg, amlodipine                                You may not have any metal on your body including hair pins and              piercings  Do not wear jewelry, lotions, powders or perfumes, deodorant                   Men may shave face and neck.   Do not bring valuables to the hospital. [place].  Contacts, dentures or bridgework may not be worn into surgery.  Leave suitcase in the car. After surgery it may be brought to your room.                  Please read over the following fact sheets you were given: _____________________________________________________________________           [facility] - Preparing for Surgery Before surgery, you can play an important role.  Because skin is not sterile, your skin needs to be as free of germs as possible.  You can reduce the number of germs on your skin by washing with CHG (chlorahexidine gluconate) soap before surgery.  CHG is an antiseptic cleaner which kills germs and bonds with the skin to continue killing germs even after washing. Please DO NOT use if you have an allergy to CHG or antibacterial soaps.  If your skin becomes reddened/irritated stop using the CHG and inform your nurse when you arrive at Short Stay. Do not shave (including legs and underarms) for at least 48 hours prior to the first CHG shower.  You may shave your face/neck. Please follow these instructions carefully:  1.  Shower with CHG  Soap the night before surgery and the  morning of Surgery.  2.  If you choose to wash your hair, wash your hair first as usual with your  normal  shampoo.  3.  After you shampoo, rinse your hair and body thoroughly to remove the  shampoo.                           4.  Use CHG as you would any other liquid soap.  You can apply chg directly  to the skin and wash                       Gently with a scrungie or clean washcloth.  5.  Apply the CHG Soap to your body ONLY FROM  THE NECK DOWN.   Do not use on face/ open                           Wound or open sores. Avoid contact with eyes, ears mouth and genitals (private parts).                       Wash face,  Genitals (private parts) with your normal soap.             6.  Wash thoroughly, paying special attention to the area where your surgery  will be performed.  7.  Thoroughly rinse your body with warm water from the neck down.  8.  DO NOT shower/wash with your normal soap after using and rinsing off  the CHG Soap.                9.  Pat yourself dry with a clean towel.            10.  Wear clean pajamas.            11.  Place clean sheets on your bed the night of your first shower and do not  sleep with pets. Day of Surgery : Do not apply any lotions/deodorants the morning of surgery.  Please wear clean clothes to the hospital/surgery center.  FAILURE TO FOLLOW THESE INSTRUCTIONS MAY RESULT IN THE CANCELLATION OF YOUR SURGERY PATIENT SIGNATURE_________________________________  NURSE SIGNATURE__________________________________  ________________________________________________________________________   Adam Phenix  An incentive spirometer is a tool that can help keep your lungs clear and active. This tool measures how well you are filling your lungs with each breath. Taking long deep breaths may help reverse or decrease the chance of developing breathing (pulmonary) problems (especially infection) following:  A long period of time  when you are unable to move or be active. BEFORE THE PROCEDURE   If the spirometer includes an indicator to show your best effort, your nurse or respiratory therapist will set it to a desired goal.  If possible, sit up straight or lean slightly forward. Try not to slouch.  Hold the incentive spirometer in an upright position. INSTRUCTIONS FOR USE  1. Sit on the edge of your bed if possible, or sit up as far as you can in bed or on a chair. 2. Hold the incentive spirometer in an upright position. 3. Breathe out normally. 4. Place the mouthpiece in your mouth and seal your lips tightly around it. 5. Breathe in slowly and as deeply as possible, raising the piston or the ball toward the top of the column. 6. Hold your breath for 3-5 seconds or for as long as possible. Allow the piston or ball to fall to the bottom of the column. 7. Remove the mouthpiece from your mouth and breathe out normally. 8. Rest for a few seconds and repeat Steps 1 through 7 at least 10 times every 1-2 hours when you are awake. Take your time and take a few normal breaths between deep breaths. 9. The spirometer may include an indicator to show your best effort. Use the indicator as a goal to work toward during each repetition. 10. After each set of 10 deep breaths, practice coughing to be sure your lungs are clear. If you have an incision (the cut made at the time of surgery), support your incision when coughing by placing a pillow or rolled up towels firmly against it. Once you are  able to get out of bed, walk around indoors and cough well. You may stop using the incentive spirometer when instructed by your caregiver.  RISKS AND COMPLICATIONS  Take your time so you do not get dizzy or light-headed.  If you are in pain, you may need to take or ask for pain medication before doing incentive spirometry. It is harder to take a deep breath if you are having pain. AFTER USE  Rest and breathe slowly and easily.  It can be  helpful to keep track of a log of your progress. Your caregiver can provide you with a simple table to help with this. If you are using the spirometer at home, follow these instructions: [place] IF:   You are having difficultly using the spirometer.  You have trouble using the spirometer as often as instructed.  Your pain medication is not giving enough relief while using the spirometer.  You develop fever of 100.5 F (38.1 C) or higher. SEEK IMMEDIATE MEDICAL CARE IF:   You cough up bloody sputum that had not been present before.  You develop fever of 102 F (38.9 C) or greater.  You develop worsening pain at or near the incision site. MAKE SURE YOU:   Understand these instructions.  Will watch your condition.  Will get help right away if you are not doing well or get worse. Document Released: 09/28/2006 Document Revised: 08/10/2011 Document Reviewed: 11/29/2006 [facility] Patient Information 2014 [place], [place].   ________________________________________________________________________

## 2018-07-06 ENCOUNTER — Encounter (HOSPITAL_COMMUNITY): Payer: Self-pay

## 2018-07-06 ENCOUNTER — Other Ambulatory Visit: Payer: Self-pay

## 2018-07-06 ENCOUNTER — Encounter (HOSPITAL_COMMUNITY)
Admission: RE | Admit: 2018-07-06 | Discharge: 2018-07-06 | Disposition: A | Discharge status: Home / Self Care | Payer: 59 | Source: Ambulatory Visit | Attending: Orthopaedic Surgery | Admitting: Orthopaedic Surgery

## 2018-07-06 DIAGNOSIS — Z01818 Encounter for other preprocedural examination: Secondary | ICD-10-CM | POA: Insufficient documentation

## 2018-07-06 DIAGNOSIS — M1611 Unilateral primary osteoarthritis, right hip: Secondary | ICD-10-CM | POA: Insufficient documentation

## 2018-07-06 LAB — CBC
HCT: 45.3 % (ref 39.0–52.0)
Hemoglobin: 15.2 g/dL (ref 13.0–17.0)
MCH: 33.4 pg (ref 26.0–34.0)
MCHC: 33.6 g/dL (ref 30.0–36.0)
MCV: 99.6 fL (ref 80.0–100.0)
PLATELETS: 160 10*3/uL (ref 150–400)
RBC: 4.55 MIL/uL (ref 4.22–5.81)
RDW: 11.5 % (ref 11.5–15.5)
WBC: 5.3 10*3/uL (ref 4.0–10.5)
nRBC: 0 % (ref 0.0–0.2)

## 2018-07-06 LAB — BASIC METABOLIC PANEL
ANION GAP: 9 (ref 5–15)
BUN: 14 mg/dL (ref 6–20)
CO2: 27 mmol/L (ref 22–32)
Calcium: 9.6 mg/dL (ref 8.9–10.3)
Chloride: 102 mmol/L (ref 98–111)
Creatinine, Ser: 0.74 mg/dL (ref 0.61–1.24)
GFR calc Af Amer: >60 mL/min (ref >60)
Glucose, Bld: 100 mg/dL — ABNORMAL HIGH (ref 70–99)
Potassium: 3.8 mmol/L (ref 3.5–5.1)
Sodium: 138 mmol/L (ref 135–145)

## 2018-07-06 LAB — SURGICAL PCR SCREEN
MRSA, PCR: NEGATIVE
Staphylococcus aureus: POSITIVE — AB

## 2018-07-08 ENCOUNTER — Inpatient Hospital Stay (HOSPITAL_COMMUNITY): Payer: 59 | Admitting: Certified Registered Nurse Anesthetist

## 2018-07-08 ENCOUNTER — Other Ambulatory Visit: Payer: Self-pay

## 2018-07-08 ENCOUNTER — Encounter (HOSPITAL_COMMUNITY): Payer: Self-pay | Admitting: Emergency Medicine

## 2018-07-08 ENCOUNTER — Inpatient Hospital Stay (HOSPITAL_COMMUNITY): Payer: 59

## 2018-07-08 ENCOUNTER — Inpatient Hospital Stay (HOSPITAL_COMMUNITY): Payer: 59 | Admitting: Physician Assistant

## 2018-07-08 ENCOUNTER — Encounter (HOSPITAL_COMMUNITY)
Admission: RE | Disposition: A | Discharge status: Home Health | Payer: Self-pay | Source: Ambulatory Visit | Attending: Orthopaedic Surgery

## 2018-07-08 ENCOUNTER — Inpatient Hospital Stay (HOSPITAL_COMMUNITY)
Admission: RE | Admit: 2018-07-08 | Discharge: 2018-07-09 | DRG: 470 | Disposition: A | Discharge status: Home Health | Payer: 59 | Source: Ambulatory Visit | Attending: Orthopaedic Surgery | Admitting: Orthopaedic Surgery

## 2018-07-08 DIAGNOSIS — I10 Essential (primary) hypertension: Secondary | ICD-10-CM | POA: Diagnosis present

## 2018-07-08 DIAGNOSIS — Z419 Encounter for procedure for purposes other than remedying health state, unspecified: Secondary | ICD-10-CM

## 2018-07-08 DIAGNOSIS — F17221 Nicotine dependence, chewing tobacco, in remission: Secondary | ICD-10-CM | POA: Diagnosis present

## 2018-07-08 DIAGNOSIS — M8788 Other osteonecrosis, other site: Secondary | ICD-10-CM | POA: Diagnosis present

## 2018-07-08 DIAGNOSIS — Z96641 Presence of right artificial hip joint: Secondary | ICD-10-CM

## 2018-07-08 DIAGNOSIS — Z79899 Other long term (current) drug therapy: Secondary | ICD-10-CM | POA: Diagnosis not present

## 2018-07-08 DIAGNOSIS — M1611 Unilateral primary osteoarthritis, right hip: Secondary | ICD-10-CM | POA: Diagnosis present

## 2018-07-08 DIAGNOSIS — Z96642 Presence of left artificial hip joint: Secondary | ICD-10-CM | POA: Diagnosis present

## 2018-07-08 HISTORY — PX: TOTAL HIP ARTHROPLASTY: SHX124

## 2018-07-08 SURGERY — ARTHROPLASTY, HIP, TOTAL, ANTERIOR APPROACH
Anesthesia: Spinal | Site: Hip | Laterality: Right

## 2018-07-08 MED ORDER — CEFAZOLIN SODIUM-DEXTROSE 2-4 GM/100ML-% IV SOLN
2.0000 g | INTRAVENOUS | Status: AC
  Administered 2018-07-08: 2 g via INTRAVENOUS
  Filled 2018-07-08: qty 100

## 2018-07-08 MED ORDER — PHENYLEPHRINE 40 MCG/ML (10ML) SYRINGE FOR IV PUSH (FOR BLOOD PRESSURE SUPPORT)
PREFILLED_SYRINGE | INTRAVENOUS | Status: AC
  Filled 2018-07-08: qty 10

## 2018-07-08 MED ORDER — METOCLOPRAMIDE HCL 5 MG/ML IJ SOLN
5.0000 mg | Freq: Three times a day (TID) | INTRAMUSCULAR | Status: DC | PRN
  Administered 2018-07-08: 10 mg via INTRAVENOUS
  Filled 2018-07-08: qty 2

## 2018-07-08 MED ORDER — ONDANSETRON HCL 4 MG PO TABS: 4.0000 mg | ORAL_TABLET | Freq: Four times a day (QID) | ORAL | Status: DC | PRN

## 2018-07-08 MED ORDER — METOCLOPRAMIDE HCL 5 MG PO TABS: 5.0000 mg | ORAL_TABLET | Freq: Three times a day (TID) | ORAL | Status: DC | PRN

## 2018-07-08 MED ORDER — LISINOPRIL 20 MG PO TABS
20.0000 mg | ORAL_TABLET | Freq: Every day | ORAL | Status: DC
  Administered 2018-07-08 – 2018-07-09 (×2): 20 mg via ORAL
  Filled 2018-07-08 (×2): qty 1

## 2018-07-08 MED ORDER — MENTHOL 3 MG MT LOZG: 1.0000 | LOZENGE | OROMUCOSAL | Status: DC | PRN

## 2018-07-08 MED ORDER — ACETAMINOPHEN 325 MG PO TABS
325.0000 mg | ORAL_TABLET | Freq: Four times a day (QID) | ORAL | Status: DC | PRN
  Administered 2018-07-09: 650 mg via ORAL
  Filled 2018-07-08: qty 2

## 2018-07-08 MED ORDER — ACETAMINOPHEN 325 MG PO TABS: 325.0000 mg | ORAL_TABLET | Freq: Once | ORAL | Status: DC

## 2018-07-08 MED ORDER — ACETAMINOPHEN 160 MG/5ML PO SOLN
325.0000 mg | Freq: Once | ORAL | Status: DC
  Filled 2018-07-08: qty 20.3

## 2018-07-08 MED ORDER — FLUTICASONE PROPIONATE 50 MCG/ACT NA SUSP
1.0000 | Freq: Every day | NASAL | Status: DC | PRN
  Filled 2018-07-08: qty 16

## 2018-07-08 MED ORDER — SODIUM CHLORIDE 0.9 % IV SOLN
INTRAVENOUS | Status: DC | PRN
  Administered 2018-07-08: 20 ug/min via INTRAVENOUS

## 2018-07-08 MED ORDER — FENTANYL CITRATE (PF) 100 MCG/2ML IJ SOLN
INTRAMUSCULAR | Status: DC | PRN
  Administered 2018-07-08 (×2): 50 ug via INTRAVENOUS

## 2018-07-08 MED ORDER — TRANEXAMIC ACID-NACL 1000-0.7 MG/100ML-% IV SOLN
1000.0000 mg | INTRAVENOUS | Status: AC
  Administered 2018-07-08: 1000 mg via INTRAVENOUS
  Filled 2018-07-08: qty 100

## 2018-07-08 MED ORDER — ONDANSETRON HCL 4 MG/2ML IJ SOLN
INTRAMUSCULAR | Status: DC | PRN
  Administered 2018-07-08: 4 mg via INTRAVENOUS

## 2018-07-08 MED ORDER — PANTOPRAZOLE SODIUM 40 MG PO TBEC
40.0000 mg | DELAYED_RELEASE_TABLET | Freq: Every day | ORAL | Status: DC
  Administered 2018-07-08 – 2018-07-09 (×2): 40 mg via ORAL
  Filled 2018-07-08 (×2): qty 1

## 2018-07-08 MED ORDER — 0.9 % SODIUM CHLORIDE (POUR BTL) OPTIME
TOPICAL | Status: DC | PRN
  Administered 2018-07-08: 1000 mL

## 2018-07-08 MED ORDER — HYDROMORPHONE HCL 1 MG/ML IJ SOLN
INTRAMUSCULAR | Status: AC
  Filled 2018-07-08: qty 1

## 2018-07-08 MED ORDER — MIDAZOLAM HCL 5 MG/5ML IJ SOLN
INTRAMUSCULAR | Status: DC | PRN
  Administered 2018-07-08: 2 mg via INTRAVENOUS

## 2018-07-08 MED ORDER — METHOCARBAMOL 500 MG PO TABS
500.0000 mg | ORAL_TABLET | Freq: Four times a day (QID) | ORAL | Status: DC | PRN
  Administered 2018-07-08 – 2018-07-09 (×2): 500 mg via ORAL
  Filled 2018-07-08 (×2): qty 1

## 2018-07-08 MED ORDER — ACETAMINOPHEN 10 MG/ML IV SOLN: 1000.0000 mg | Freq: Once | INTRAVENOUS | Status: DC | PRN

## 2018-07-08 MED ORDER — VITAMIN C 500 MG PO TABS: 2000.0000 mg | ORAL_TABLET | Freq: Every day | ORAL | Status: DC

## 2018-07-08 MED ORDER — ASPIRIN 81 MG PO CHEW: 81.0000 mg | CHEWABLE_TABLET | Freq: Two times a day (BID) | ORAL | 0 refills | Status: AC

## 2018-07-08 MED ORDER — PROPOFOL 10 MG/ML IV BOLUS
INTRAVENOUS | Status: AC
  Filled 2018-07-08: qty 40

## 2018-07-08 MED ORDER — METHOCARBAMOL 500 MG IVPB - SIMPLE MED
INTRAVENOUS | Status: AC
  Filled 2018-07-08: qty 50

## 2018-07-08 MED ORDER — CARVEDILOL 6.25 MG PO TABS
6.2500 mg | ORAL_TABLET | Freq: Every day | ORAL | Status: DC
  Administered 2018-07-09: 6.25 mg via ORAL
  Filled 2018-07-08: qty 1

## 2018-07-08 MED ORDER — POLYVINYL ALCOHOL 1.4 % OP SOLN
1.0000 [drp] | OPHTHALMIC | Status: DC | PRN
  Filled 2018-07-08: qty 15

## 2018-07-08 MED ORDER — CEFAZOLIN SODIUM-DEXTROSE 1-4 GM/50ML-% IV SOLN
1.0000 g | Freq: Four times a day (QID) | INTRAVENOUS | Status: AC
  Administered 2018-07-08 (×2): 1 g via INTRAVENOUS
  Filled 2018-07-08 (×2): qty 50

## 2018-07-08 MED ORDER — ONDANSETRON HCL 4 MG/2ML IJ SOLN: 4.0000 mg | Freq: Four times a day (QID) | INTRAMUSCULAR | Status: DC | PRN

## 2018-07-08 MED ORDER — BUPIVACAINE IN DEXTROSE 0.75-8.25 % IT SOLN
INTRATHECAL | Status: DC | PRN
  Administered 2018-07-08: 1.8 mL via INTRATHECAL

## 2018-07-08 MED ORDER — MIDAZOLAM HCL 2 MG/2ML IJ SOLN
INTRAMUSCULAR | Status: AC
  Filled 2018-07-08: qty 2

## 2018-07-08 MED ORDER — PROPOFOL 500 MG/50ML IV EMUL
INTRAVENOUS | Status: DC | PRN
  Administered 2018-07-08: 125 ug/kg/min via INTRAVENOUS

## 2018-07-08 MED ORDER — ALUM & MAG HYDROXIDE-SIMETH 200-200-20 MG/5ML PO SUSP: 30.0000 mL | ORAL | Status: DC | PRN

## 2018-07-08 MED ORDER — LACTATED RINGERS IV SOLN: INTRAVENOUS | Status: DC

## 2018-07-08 MED ORDER — OXYCODONE HCL 5 MG PO TABS
5.0000 mg | ORAL_TABLET | ORAL | Status: DC | PRN
  Administered 2018-07-08: 10 mg via ORAL
  Administered 2018-07-08: 5 mg via ORAL
  Administered 2018-07-09 (×2): 10 mg via ORAL
  Filled 2018-07-08: qty 1
  Filled 2018-07-08 (×4): qty 2

## 2018-07-08 MED ORDER — DIPHENHYDRAMINE HCL 12.5 MG/5ML PO ELIX: 12.5000 mg | ORAL_SOLUTION | ORAL | Status: DC | PRN

## 2018-07-08 MED ORDER — AMLODIPINE BESYLATE 10 MG PO TABS
10.0000 mg | ORAL_TABLET | Freq: Every day | ORAL | Status: DC
  Administered 2018-07-09: 10 mg via ORAL
  Filled 2018-07-08: qty 1

## 2018-07-08 MED ORDER — PHENYLEPHRINE HCL 10 MG/ML IJ SOLN
INTRAMUSCULAR | Status: AC
  Filled 2018-07-08: qty 1

## 2018-07-08 MED ORDER — HYDROMORPHONE HCL 1 MG/ML IJ SOLN: 0.5000 mg | INTRAMUSCULAR | Status: DC | PRN

## 2018-07-08 MED ORDER — DOCUSATE SODIUM 100 MG PO CAPS
100.0000 mg | ORAL_CAPSULE | Freq: Two times a day (BID) | ORAL | Status: DC
  Administered 2018-07-08 – 2018-07-09 (×2): 100 mg via ORAL
  Filled 2018-07-08 (×2): qty 1

## 2018-07-08 MED ORDER — MEPERIDINE HCL 50 MG/ML IJ SOLN: 6.2500 mg | INTRAMUSCULAR | Status: DC | PRN

## 2018-07-08 MED ORDER — SODIUM CHLORIDE 0.9 % IV SOLN
INTRAVENOUS | Status: DC
  Administered 2018-07-08: 75 mL/h via INTRAVENOUS

## 2018-07-08 MED ORDER — PROPOFOL 10 MG/ML IV BOLUS
INTRAVENOUS | Status: AC
  Filled 2018-07-08: qty 60

## 2018-07-08 MED ORDER — FENTANYL CITRATE (PF) 100 MCG/2ML IJ SOLN
INTRAMUSCULAR | Status: AC
  Filled 2018-07-08: qty 2

## 2018-07-08 MED ORDER — OXYCODONE HCL 5 MG PO TABS
10.0000 mg | ORAL_TABLET | ORAL | Status: DC | PRN
  Administered 2018-07-09: 10 mg via ORAL

## 2018-07-08 MED ORDER — PHENYLEPHRINE 40 MCG/ML (10ML) SYRINGE FOR IV PUSH (FOR BLOOD PRESSURE SUPPORT)
PREFILLED_SYRINGE | INTRAVENOUS | Status: DC | PRN
  Administered 2018-07-08 (×3): 80 ug via INTRAVENOUS
  Administered 2018-07-08: 120 ug via INTRAVENOUS

## 2018-07-08 MED ORDER — LACTATED RINGERS IV SOLN
INTRAVENOUS | Status: DC
  Administered 2018-07-08 (×2): via INTRAVENOUS

## 2018-07-08 MED ORDER — OXYCODONE HCL 5 MG PO TABS: 5.0000 mg | ORAL_TABLET | ORAL | 0 refills | Status: DC | PRN

## 2018-07-08 MED ORDER — ONDANSETRON HCL 4 MG/2ML IJ SOLN
INTRAMUSCULAR | Status: AC
  Filled 2018-07-08: qty 2

## 2018-07-08 MED ORDER — ASPIRIN 81 MG PO CHEW
81.0000 mg | CHEWABLE_TABLET | Freq: Two times a day (BID) | ORAL | Status: DC
  Administered 2018-07-08 – 2018-07-09 (×2): 81 mg via ORAL
  Filled 2018-07-08 (×2): qty 1

## 2018-07-08 MED ORDER — METHOCARBAMOL 500 MG PO TABS: 500.0000 mg | ORAL_TABLET | Freq: Four times a day (QID) | ORAL | 0 refills | Status: AC | PRN

## 2018-07-08 MED ORDER — SODIUM CHLORIDE 0.9 % IR SOLN
Status: DC | PRN
  Administered 2018-07-08: 1000 mL

## 2018-07-08 MED ORDER — PHENOL 1.4 % MT LIQD
1.0000 | OROMUCOSAL | Status: DC | PRN
  Filled 2018-07-08: qty 177

## 2018-07-08 MED ORDER — METHOCARBAMOL 500 MG IVPB - SIMPLE MED
500.0000 mg | Freq: Four times a day (QID) | INTRAVENOUS | Status: DC | PRN
  Administered 2018-07-08: 500 mg via INTRAVENOUS
  Filled 2018-07-08: qty 50

## 2018-07-08 MED ORDER — PROMETHAZINE HCL 25 MG/ML IJ SOLN: 6.2500 mg | INTRAMUSCULAR | Status: DC | PRN

## 2018-07-08 MED ORDER — PROPOFOL 10 MG/ML IV BOLUS
INTRAVENOUS | Status: DC | PRN
  Administered 2018-07-08: 10 mg via INTRAVENOUS
  Administered 2018-07-08: 20 mg via INTRAVENOUS

## 2018-07-08 MED ORDER — POLYETHYLENE GLYCOL 3350 17 G PO PACK: 17.0000 g | PACK | Freq: Every day | ORAL | Status: DC | PRN

## 2018-07-08 MED ORDER — CHLORHEXIDINE GLUCONATE 4 % EX LIQD: 60.0000 mL | Freq: Once | CUTANEOUS | Status: DC

## 2018-07-08 MED ORDER — POLYETHYL GLYCOL-PROPYL GLYCOL 0.4-0.3 % OP SOLN: 1.0000 [drp] | Freq: Three times a day (TID) | OPHTHALMIC | Status: DC | PRN

## 2018-07-08 MED ORDER — HYDROMORPHONE HCL 1 MG/ML IJ SOLN
0.2500 mg | INTRAMUSCULAR | Status: DC | PRN
  Administered 2018-07-08 (×4): 0.5 mg via INTRAVENOUS

## 2018-07-08 MED ORDER — VITAMIN C 500 MG PO TABS
2000.0000 mg | ORAL_TABLET | Freq: Every day | ORAL | Status: DC
  Administered 2018-07-09: 2000 mg via ORAL
  Filled 2018-07-08: qty 4

## 2018-07-08 MED ORDER — ZOLPIDEM TARTRATE 5 MG PO TABS: 5.0000 mg | ORAL_TABLET | Freq: Every evening | ORAL | Status: DC | PRN

## 2018-07-08 SURGICAL SUPPLY — 43 items
ACETAB CUP W/GRIPTION 54 (Plate) ×2 IMPLANT
APL SKNCLS STERI-STRIP NONHPOA (GAUZE/BANDAGES/DRESSINGS) ×1
ARTICULEZE HEAD (Hips) ×2 IMPLANT
BAG SPEC THK2 15X12 ZIP CLS (MISCELLANEOUS) ×1
BAG ZIPLOCK 12X15 (MISCELLANEOUS) ×1 IMPLANT
BENZOIN TINCTURE PRP APPL 2/3 (GAUZE/BANDAGES/DRESSINGS) ×1 IMPLANT
BLADE SAW SGTL 18X1.27X75 (BLADE) ×2 IMPLANT
BLADE SURG SZ10 CARB STEEL (BLADE) ×4 IMPLANT
COVER PERINEAL POST (MISCELLANEOUS) ×2 IMPLANT
COVER SURGICAL LIGHT HANDLE (MISCELLANEOUS) ×2 IMPLANT
COVER WAND RF STERILE (DRAPES) ×1 IMPLANT
CUP ACETAB W/GRIPTION 54 (Plate) IMPLANT
DRAPE STERI IOBAN 125X83 (DRAPES) ×2 IMPLANT
DRAPE U-SHAPE 47X51 STRL (DRAPES) ×4 IMPLANT
DRSG AQUACEL AG ADV 3.5X10 (GAUZE/BANDAGES/DRESSINGS) ×2 IMPLANT
DURAPREP 26ML APPLICATOR (WOUND CARE) ×2 IMPLANT
ELECT REM PT RETURN 15FT ADLT (MISCELLANEOUS) ×2 IMPLANT
GAUZE XEROFORM 1X8 LF (GAUZE/BANDAGES/DRESSINGS) IMPLANT
GLOVE BIO SURGEON STRL SZ7.5 (GLOVE) ×2 IMPLANT
GLOVE BIOGEL PI IND STRL 8 (GLOVE) ×2 IMPLANT
GLOVE BIOGEL PI INDICATOR 8 (GLOVE) ×2
GLOVE ECLIPSE 8.0 STRL XLNG CF (GLOVE) ×2 IMPLANT
GOWN STRL REUS W/TWL XL LVL3 (GOWN DISPOSABLE) ×4 IMPLANT
HANDPIECE INTERPULSE COAX TIP (DISPOSABLE) ×2
HEAD ARTICULEZE (Hips) IMPLANT
HEAD M SROM 36MM PLUS 1.5 (Hips) IMPLANT
HOLDER FOLEY CATH W/STRAP (MISCELLANEOUS) ×2 IMPLANT
LINER NEUTRAL 36ID 54OD (Liner) ×1 IMPLANT
PACK ANTERIOR HIP CUSTOM (KITS) ×2 IMPLANT
SCREW 6.5MMX25MM (Screw) ×1 IMPLANT
SET HNDPC FAN SPRY TIP SCT (DISPOSABLE) ×1 IMPLANT
SROM M HEAD 36MM PLUS 1.5 (Hips) ×2 IMPLANT
STAPLER VISISTAT 35W (STAPLE) IMPLANT
STEM CORAIL KA11 (Stem) ×1 IMPLANT
STRIP CLOSURE SKIN 1/2X4 (GAUZE/BANDAGES/DRESSINGS) ×1 IMPLANT
SUT ETHIBOND NAB CT1 #1 30IN (SUTURE) ×2 IMPLANT
SUT MNCRL AB 4-0 PS2 18 (SUTURE) IMPLANT
SUT VIC AB 0 CT1 36 (SUTURE) ×2 IMPLANT
SUT VIC AB 1 CT1 36 (SUTURE) ×2 IMPLANT
SUT VIC AB 2-0 CT1 27 (SUTURE) ×4
SUT VIC AB 2-0 CT1 TAPERPNT 27 (SUTURE) ×2 IMPLANT
TRAY FOLEY MTR SLVR 16FR STAT (SET/KITS/TRAYS/PACK) ×2 IMPLANT
YANKAUER SUCT BULB TIP 10FT TU (MISCELLANEOUS) ×2 IMPLANT

## 2018-07-08 NOTE — Anesthesia Preprocedure Evaluation (Addendum)
Anesthesia Evaluation  Patient identified by MRN, date of birth, ID band Patient awake    Reviewed: Allergy & Precautions, NPO status , Patient's Chart, lab work & pertinent test results  Airway Mallampati: I  TM Distance: >3 FB Neck ROM: Full    Dental  (+) Teeth Intact, Dental Advisory Given   Pulmonary neg pulmonary ROS,    breath sounds clear to auscultation       Cardiovascular hypertension, Pt. on medications and Pt. on home beta blockers  Rhythm:Regular Rate:Normal     Neuro/Psych negative neurological ROS  negative psych ROS   GI/Hepatic negative GI ROS, Neg liver ROS,   Endo/Other  negative endocrine ROS  Renal/GU negative Renal ROS     Musculoskeletal  (+) Arthritis ,   Abdominal Normal abdominal exam  (+)   Peds  Hematology negative hematology ROS (+)   Anesthesia Other Findings   Reproductive/Obstetrics                           Lab Results  Component Value Date   WBC 5.3 07/06/2018   HGB 15.2 07/06/2018   HCT 45.3 07/06/2018   MCV 99.6 07/06/2018   PLT 160 07/06/2018   Lab Results  Component Value Date   CREATININE 0.74 07/06/2018   BUN 14 07/06/2018   NA 138 07/06/2018   K 3.8 07/06/2018   CL 102 07/06/2018   CO2 27 07/06/2018   Lab Results  Component Value Date   INR 0.96 12/20/2012     Anesthesia Physical Anesthesia Plan  ASA: II  Anesthesia Plan: Spinal   Post-op Pain Management:    Induction: Intravenous  PONV Risk Score and Plan: 2 and Ondansetron, Propofol infusion and Midazolam  Airway Management Planned: Natural Airway and Simple Face Mask  Additional Equipment:   Intra-op Plan:   Post-operative Plan:   Informed Consent: I have reviewed the patients History and Physical, chart, labs and discussed the procedure including the risks, benefits and alternatives for the proposed anesthesia with the patient or authorized representative who has  indicated his/her understanding and acceptance.       Plan Discussed with: CRNA  Anesthesia Plan Comments:        Anesthesia Quick Evaluation

## 2018-07-08 NOTE — Evaluation (Signed)
Physical Therapy Evaluation Patient Details Name: James Whitehead MRN: [id number] DOB: 1955/07/23 Today's Date: 07/08/2018   History of Present Illness  63 yo male s/p R DA-THA on 07/08/18. PMH includes OA, HTN, L THA posterior approach 2014.  Clinical Impression  Pt presents with R hip pain, increased effort to perform mobility tasks, post-surgical R hip weakness, and difficulty performing mobility tasks. Pt to benefit from acute PT to address deficits. Pt ambulated 40 ft with RW with min guard assist, verbal cuing provided throughout. Pt states his last hip replacement was not this painful, but pt still only describes pain as moderate. PT to progress mobility as tolerated, and will continue to follow acutely.        Follow Up Recommendations Follow surgeon's recommendation for DC plan and follow-up therapies;Supervision for mobility/OOB    Equipment Recommendations  None recommended by PT    Recommendations for Other Services       Precautions / Restrictions Precautions Precautions: Fall Restrictions Weight Bearing Restrictions: No Other Position/Activity Restrictions: WBAT       Mobility  Bed Mobility Overal bed mobility: Needs Assistance Bed Mobility: Supine to Sit     Supine to sit: Min guard;HOB elevated     General bed mobility comments: Min guard for safety. Pt moving very quickly and groaning in pain, PT not able to physically assist with RLE due to pt moving so swiftly.   Transfers Overall transfer level: Needs assistance Equipment used: Rolling walker (2 wheeled) Transfers: Sit to/from Stand Sit to Stand: Min guard;From elevated surface         General transfer comment: Min guard for safety. Verbal cuing for hand placement when rising, hip extension to complete standing.   Ambulation/Gait Ambulation/Gait assistance: Min guard Gait Distance (Feet): 40 Feet Assistive device: Rolling walker (2 wheeled) Gait Pattern/deviations: Step-to pattern;Decreased  weight shift to right;Decreased stance time - right;Antalgic;Trunk flexed Gait velocity: decr    General Gait Details: Min guard for safety. Verbal cuing for upright posture, sequencing with short step-to gait, turning, and placement in RW.   Stairs            Wheelchair Mobility    Modified Rankin (Stroke Patients Only)       Balance Overall balance assessment: Mild deficits observed, not formally tested                                           Pertinent Vitals/Pain Pain Assessment: 0-10 Pain Score: 5  Pain Location: R hip  Pain Descriptors / Indicators: Sore;Aching Pain Intervention(s): Repositioned;Limited activity within patient's tolerance;Ice applied;Monitored during session    [place] expects to be discharged to:: Private residence Living Arrangements: Alone Available Help at Discharge: Family;Available PRN/intermittently(pt's uncle is his next door neighbor) Type of Home: House Home Access: Stairs to enter Entrance Stairs-Rails: None Entrance Stairs-Number of Steps: 2 Home Layout: One level Home Equipment: Environmental consultant - 2 wheels;Cane - single point;Bedside commode      Prior Function Level of Independence: Independent               Hand Dominance   Dominant Hand: Right    Extremity/Trunk Assessment   Upper Extremity Assessment Upper Extremity Assessment: Overall WFL for tasks assessed    Lower Extremity Assessment Lower Extremity Assessment: Overall WFL for tasks assessed;RLE deficits/detail RLE Deficits / Details: suspected post-surgical hip weakness; able to perform ankle  pumps, quad set, heel slide  RLE Sensation: WNL(Pt reports "my legs are kinda numb sometimes, even before the surgery due to a work accident")    Cervical / Trunk Assessment Cervical / Trunk Assessment: Normal  Communication   Communication: No difficulties  Cognition Arousal/Alertness: Awake/alert Behavior During Therapy: WFL for  tasks assessed/performed Overall Cognitive Status: Within Functional Limits for tasks assessed                                        General Comments      Exercises     Assessment/Plan    PT Assessment Patient needs continued PT services  PT Problem List Decreased strength;Pain;Decreased activity tolerance;Decreased knowledge of use of DME;Decreased balance;Decreased mobility       PT Treatment Interventions DME instruction;Therapeutic activities;Gait training;Therapeutic exercise;Patient/family education;Stair training;Balance training;Functional mobility training    PT Goals (Current goals can be found in the Care Plan section)  Acute Rehab PT Goals Patient Stated Goal: go home ASAP PT Goal Formulation: With patient Time For Goal Achievement: 07/15/18 Potential to Achieve Goals: Good    Frequency 7X/week   Barriers to discharge        Co-evaluation               AM-PAC PT "6 Clicks" Mobility  Outcome Measure Help needed turning from your back to your side while in a flat bed without using bedrails?: A Little Help needed moving from lying on your back to sitting on the side of a flat bed without using bedrails?: A Little Help needed moving to and from a bed to a chair (including a wheelchair)?: A Little Help needed standing up from a chair using your arms (e.g., wheelchair or bedside chair)?: A Little Help needed to walk in hospital room?: A Little Help needed climbing 3-5 steps with a railing? : A Little 6 Click Score: 18    End of Session Equipment Utilized During Treatment: Gait belt Activity Tolerance: Patient tolerated treatment well;Patient limited by pain Patient left: in bed;with bed alarm set;with call bell/phone within reach;with SCD's reapplied Nurse Communication: Mobility status;Other (comment)(Pt in moderate pain ) PT Visit Diagnosis: Other abnormalities of gait and mobility (R26.89);Difficulty in walking, not elsewhere  classified (R26.2)    Time: 9147-8295 PT Time Calculation (min) (ACUTE ONLY): 24 min   Charges:   PT Evaluation $PT Eval Low Complexity: 1 Low PT Treatments $Gait Training: 8-22 mins      Julien Girt, PT Acute Rehabilitation Services Pager [phone]  Office [phone]   Roxine Caddy D Elonda Husky 07/08/2018, 7:22 PM

## 2018-07-08 NOTE — H&P (Signed)
TOTAL HIP ADMISSION H&P  Patient is admitted for right total hip arthroplasty.  Subjective:  Chief Complaint: right hip pain  HPI: James Whitehead, 63 y.o. male, has a history of pain and functional disability in the right hip(s) due to arthritis and patient has failed non-surgical conservative treatments for greater than 12 weeks to include NSAID's and/or analgesics, corticosteriod injections, flexibility and strengthening excercises, use of assistive devices, weight reduction as appropriate and activity modification.  Onset of symptoms was gradual starting 3 years ago with gradually worsening course since that time.The patient noted no past surgery on the right hip(s).  Patient currently rates pain in the right hip at 10 out of 10 with activity. Patient has night pain, worsening of pain with activity and weight bearing, trendelenberg gait, pain that interfers with activities of daily living, pain with passive range of motion and crepitus. Patient has evidence of subchondral cysts, subchondral sclerosis, periarticular osteophytes and joint space narrowing by imaging studies. This condition presents safety issues increasing the risk of falls.  There is no current active infection.  Patient Active Problem List   Diagnosis Date Noted  . Unilateral primary osteoarthritis, right hip 07/08/2018  . Pain in finger of right hand 07/15/2016  . Avascular necrosis of bone of left hip ([place]) 12/29/2012   Past Medical History:  Diagnosis Date  . Arthritis   . Hypertension   . Left hydrocele   . Spermatocele    left    Past Surgical History:  Procedure Laterality Date  . HYDROCELE EXCISION Left 03/01/2015   Procedure: HYDROCELECTOMY ADULT;  Surgeon: Alexis Frock, MD;  Location: [facility];  Service: Urology;  Laterality: Left;  . INGUINAL HERNIA REPAIR Bilateral 2006  . ORCHIOPEXY Left 03/01/2015   Procedure: ORCHIOPEXY ADULT;  Surgeon: Alexis Frock, MD;  Location[facility];  Service: Urology;  Laterality: Left;  . TONSILLECTOMY  as child  . TOTAL HIP ARTHROPLASTY Left 12/28/2012   Procedure: TOTAL HIP ARTHROPLASTY- left;  Surgeon: Newt Minion, MD;  Location: [place];  Service: Orthopedics;  Laterality: Left;  Left Total Hip Arthroplasty    No current facility-administered medications for this encounter.    Current Outpatient Medications  Medication Sig Dispense Refill Last Dose  . amLODipine (NORVASC) 10 MG tablet Take 10 mg by mouth daily.    Taking  . ascorbic acid (VITAMIN C) 1000 MG tablet Take 2,000 mg by mouth daily.    Taking  . carvedilol (COREG) 6.25 MG tablet Take 6.25 mg by mouth daily.    Taking  . fluticasone (FLONASE) 50 MCG/ACT nasal spray Place 1 spray into both nostrils daily as needed (allergies.).    Taking  . Glucosamine HCl (GLUCOSAMINE PO) Take 1 tablet by mouth daily.     Marland Kitchen lisinopril (PRINIVIL,ZESTRIL) 20 MG tablet Take 20 mg by mouth daily.    Taking  . MILK THISTLE PO Take 1-2 tablets by mouth See admin instructions. Take 2 tablets by mouth in the morning & 1 capsule by mouth at noon.     . Multiple Vitamins-Minerals (ANTIOXIDANT PO) Take 1 capsule by mouth daily.     . Omega-3 Fatty Acids (FISH OIL PO) Take 2 capsules by mouth daily.     Vladimir Faster Glycol-Propyl Glycol (LUBRICANT EYE DROPS) 0.4-0.3 % SOLN Place 1 drop into both eyes 3 (three) times daily as needed (dry/irritated eyes.).      No Known Allergies  Social History   Tobacco Use  . Smoking  status: Never Smoker  . Smokeless tobacco: Former Systems developer    Types: Chew  Substance Use Topics  . Alcohol use: Yes    Alcohol/week: 2.0 standard drinks    Types: 2 Cans of beer per week    Comment: every day     No family history on file.   Review of Systems  Musculoskeletal: Positive for joint pain.  All other systems reviewed and are negative.   Objective:  Physical Exam  Constitutional: He is oriented to person, place, and time. He appears well-developed and  well-nourished.  HENT:  Head: Normocephalic and atraumatic.  Eyes: Pupils are equal, round, and reactive to light. EOM are normal.  Neck: Normal range of motion. Neck supple.  Cardiovascular: Normal rate.  Respiratory: Effort normal.  GI: Soft.  Musculoskeletal:     Right hip: He exhibits decreased range of motion, decreased strength, tenderness and bony tenderness.  Neurological: He is alert and oriented to person, place, and time.  Skin: Skin is warm and dry.  Psychiatric: He has a normal mood and affect.    Vital signs in last 24 hours:    Labs:   Estimated body mass index is 27.53 kg/m as calculated from the following:   Height as of 07/06/18: 6' (1.829 m).   Weight as of 10/12/17: 92.1 kg.   Imaging Review Plain radiographs demonstrate severe degenerative joint disease of the right hip(s). The bone quality appears to be good for age and reported activity level.      Assessment/Plan:  End stage arthritis, right hip(s)  The patient history, physical examination, clinical judgement of the provider and imaging studies are consistent with end stage degenerative joint disease of the right hip(s) and total hip arthroplasty is deemed medically necessary. The treatment options including medical management, injection therapy, arthroscopy and arthroplasty were discussed at length. The risks and benefits of total hip arthroplasty were presented and reviewed. The risks due to aseptic loosening, infection, stiffness, dislocation/subluxation,  thromboembolic complications and other imponderables were discussed.  The patient acknowledged the explanation, agreed to proceed with the plan and consent was signed. Patient is being admitted for inpatient treatment for surgery, pain control, PT, OT, prophylactic antibiotics, VTE prophylaxis, progressive ambulation and ADL's and discharge planning.The patient is planning to be discharged home with home health services   Anticipated LOS equal to or  greater than 2 midnights due to - Age 54 and older with one or more of the following:  - Obesity  - Expected need for hospital services (PT, OT, Nursing) required for safe  discharge  - Anticipated need for postoperative skilled nursing care or inpatient rehab  - Active co-morbidities: None OR   - Unanticipated findings during/Post Surgery: None  - Patient is a high risk of re-admission due to: None

## 2018-07-08 NOTE — Transfer of Care (Signed)
Immediate Anesthesia Transfer of Care Note  Patient: James Whitehead  Procedure(s) Performed: RIGHT TOTAL HIP ARTHROPLASTY ANTERIOR APPROACH (Right Hip)  Patient Location: PACU  Anesthesia Type:MAC and Spinal  Level of Consciousness: awake, alert  and oriented  Airway & Oxygen Therapy: Patient Spontanous Breathing and Patient connected to face mask oxygen  Post-op Assessment: Report given to RN and Post -op Vital signs reviewed and stable  Post vital signs: Reviewed and stable  Last Vitals:  Vitals Value Taken Time  BP 109/67 07/08/2018  1:27 PM  Temp    Pulse 78 07/08/2018  1:28 PM  Resp 22 07/08/2018  1:28 PM  SpO2 98 % 07/08/2018  1:28 PM  Vitals shown include unvalidated device data.  Last Pain:  Vitals:   07/08/18 0901  TempSrc: Oral  PainSc: 0-No pain      Patients Stated Pain Goal: 4 (81/01/75 1025)  Complications: No apparent anesthesia complications

## 2018-07-08 NOTE — Anesthesia Procedure Notes (Signed)
Spinal  Start time: 07/08/2018 11:44 AM End time: 07/08/2018 11:46 AM Staffing Anesthesiologist: Effie Berkshire, MD Performed: anesthesiologist  Preanesthetic Checklist Completed: patient identified, site marked, surgical consent, pre-op evaluation, timeout performed, IV checked, risks and benefits discussed and monitors and equipment checked Spinal Block Patient position: sitting Prep: site prepped and draped and DuraPrep Location: L3-4 Injection technique: single-shot Needle Needle type: Pencan  Needle gauge: 24 G Needle length: 10 cm Needle insertion depth: 10 cm Additional Notes Patient tolerated well. No immediate complications.

## 2018-07-08 NOTE — Brief Op Note (Signed)
07/08/2018  1:13 PM  PATIENT:  James Whitehead  63 y.o. male  PRE-OPERATIVE DIAGNOSIS:  osteoarthritis right hip  POST-OPERATIVE DIAGNOSIS:  osteoarthritis right hip  PROCEDURE:  Procedure(s): RIGHT TOTAL HIP ARTHROPLASTY ANTERIOR APPROACH (Right)  SURGEON:  Surgeon(s) and Role:    Mcarthur Rossetti, MD - Primary  PHYSICIAN ASSISTANT: Benita Stabile, PA-C  ANESTHESIA:   spinal  EBL:  150 mL   COUNTS:  YES  TOURNIQUET:  * No tourniquets in log *  DICTATION: .Other Dictation: Dictation Number [id number]  PLAN OF CARE: Admit to inpatient   PATIENT DISPOSITION:  PACU - hemodynamically stable.   Delay start of Pharmacological VTE agent (>24hrs) due to surgical blood loss or risk of bleeding: no

## 2018-07-08 NOTE — Anesthesia Postprocedure Evaluation (Signed)
Anesthesia Post Note  Patient: CHASON MCIVER  Procedure(s) Performed: RIGHT TOTAL HIP ARTHROPLASTY ANTERIOR APPROACH (Right Hip)     Patient location during evaluation: PACU Anesthesia Type: Spinal Level of consciousness: oriented and awake and alert Pain management: pain level controlled Vital Signs Assessment: post-procedure vital signs reviewed and stable Respiratory status: spontaneous breathing, respiratory function stable and patient connected to nasal cannula oxygen Cardiovascular status: blood pressure returned to baseline and stable Postop Assessment: no headache, no backache, no apparent nausea or vomiting, spinal receding and patient able to bend at knees Anesthetic complications: no    Last Vitals:  Vitals:   07/08/18 1500 07/08/18 1515  BP: 136/83 124/66  Pulse: 71 67  Resp: 16 14  Temp:  37.2 C  SpO2: 97% 95%    Last Pain:  Vitals:   07/08/18 1515  TempSrc:   PainSc: 2                  Effie Berkshire

## 2018-07-08 NOTE — Op Note (Signed)
NAME: James Whitehead, BARTNIK MEDICAL RECORD [id number] ACCOUNT [id number] DATE OF BIRTH:1956/06/01 FACILITY: [facility] LOCATION: [facility] PHYSICIAN:Shadrach Bartunek Kerry Fort, MD  OPERATIVE REPORT  DATE OF PROCEDURE:  07/08/2018  PREOPERATIVE DIAGNOSIS:  Primary osteoarthritis and degenerative joint disease, right hip.  POSTOPERATIVE DIAGNOSIS:  Primary osteoarthritis and degenerative joint disease, right hip.  PROCEDURE:  Right total hip arthroplasty through direct anterior approach.  IMPLANTS:  DePuy Sector Gription acetabular component size 54, size 36+0 polyethylene liner, size 11 Corail femoral component with standard offset, size 36+1.5 metal hip ball.  SURGEON:  Lind Guest. Ninfa Linden, MD  ASSISTANT:  Erskine Emery, PA-C.  ANESTHESIA:  Spinal.  ANTIBIOTICS:  Two grams IV Ancef.  ESTIMATED BLOOD LOSS:  300 mL.  COMPLICATIONS:  None.  INDICATIONS:  The patient is a very pleasant 63 year old gentleman with debilitating arthritis involving his right hip.  He actually has a left total hip arthroplasty that was performed by one of my  partners several years ago.  He was then referred to  me for this hip.  He would like to have a direct anterior hip replacement on this side.  His x-rays do show complete loss of joint space with severe osteoarthritis in that hip.  His left total hip moves well and he is very pleased with.  His right hip  hurts in the groin and he has significant limitations in his rotation of that hip.  This has also detrimentally affected his mobility his quality of life and his activities of daily living to the point he does wish to proceed with total hip arthroplasty  on the right side.  We had a long and thorough discussion about surgery.  I talked about the risks of acute blood loss anemia, nerve or vessel injury, fracture, infection, DVT, dislocation and implant failure.  Our goals being decreased pain, improve  mobility and overall improve quality of  life.  DESCRIPTION OF PROCEDURE:  After informed consent was obtained and appropriate right hip was marked.  He was brought to the operating room and sat up on the stretcher where spinal anesthesia was then obtained.  He was then laid in the supine position.  A  Foley catheter was placed.  I was able to assess his leg lengths and found his right operative side definitely decently shorter than the left.  Traction boots were placed on both his feet.  Next, he was placed supine on the Hana fracture table, the  perineal post in place and both legs in line skeletal traction device and no traction applied.  His right operative hip was prepped and draped with DuraPrep and sterile drapes.  A time-out was called and he was identified as correct patient, correct  right hip.  I then made an incision just inferior and posterior to the anterior superior iliac spine and carried this obliquely down the leg.  We dissected down to the tensor fascia lata muscle.  Tensor fascia was then divided longitudinally to proceed  with direct anterior approach to the hip.  We identified and cauterized circumflex vessels.  I then identified the hip capsule.  I opened the hip capsule in an L-type format finding a moderate joint effusion and significant periarticular osteophytes  around the femoral head and neck.  I placed Cobra retractors around the medial and lateral femoral neck and made our femoral neck cut with an oscillating saw and completed this with an osteotome.  We placed a corkscrew guide in the femoral head and  removed the femoral heads entirety and found  a wide area devoid of cartilage.  I then placed a bent Hohmann over the medial acetabular rim and removed remnants of the acetabular labrum and other debris.  I began with a size 44 reamer going in stepwise  increments up to a size 53 with all reamers under direct visualization, the last reamer under direct fluoroscopy, so I could obtain our depth of reaming, our  inclination and anteversion.  I then placed the real DePuy Sector Gription acetabular component  size 54 and a 36+0 polyethylene liner for that size acetabular component.  Attention was then turned to the femur.  With the leg externally rotated to 120 degrees, extended and adducted, I used a Mueller retractor medially and Hohman retractor behind the  greater trochanter, used a box-cutting osteotome to enter from the femoral canal and a rongeur to lateralize and then began broaching using the Corail broaching system from a size 8 going up to a size 11.  With a size 11 in place, we trialed a standard  offset femoral neck and a 36-2 hip ball and reduced this in the acetabulum.  We felt like we needed a little bit more leg length and offset.  We then dislocated the hip and removed the trial components.  I placed the real Corail femoral component size 11  with a 36+5 metal hip ball.  We tried to reduce this in the acetabulum and actually it was too long.  I marked that ball off and went with a 36+1.5 and that reduced the acetabulum nicely.  I feel that it was likely related to soft tissue contractures  from him being short on his right side for so long.  Once we reduced the acetabulum, I was pleased with the range of motion and stability on clinical exam and assessing this under direct fluoroscopy.  We then irrigated the soft tissue with normal saline  solution using pulsatile lavage.  We closed the joint capsule with interrupted #1 Ethibond suture, followed by running 0 Vicryl and tensor fascia, 0 Vicryl in the deep tissue, 2-0 Vicryl subcutaneous tissue, 4-0 Monocryl subcuticular stitch and  Steri-Strips on skin.  An Aquacel dressing was applied.    He was taken off the Hana table and taken to recovery room in stable condition.    All final counts were correct.   There were no complications noted.   Of note, Benita Stabile, PA-C, assisted the entire case.  Assistance was crucial for facilitating all aspects  of this case.  AN/NUANCE  D:07/08/2018 T:07/08/2018 JOB:[id number]

## 2018-07-09 LAB — CBC
HCT: 38.4 % — ABNORMAL LOW (ref 39.0–52.0)
Hemoglobin: 12.6 g/dL — ABNORMAL LOW (ref 13.0–17.0)
MCH: 34.4 pg — ABNORMAL HIGH (ref 26.0–34.0)
MCHC: 32.8 g/dL (ref 30.0–36.0)
MCV: 104.9 fL — ABNORMAL HIGH (ref 80.0–100.0)
NRBC: 0 % (ref 0.0–0.2)
Platelets: 137 10*3/uL — ABNORMAL LOW (ref 150–400)
RBC: 3.66 MIL/uL — ABNORMAL LOW (ref 4.22–5.81)
RDW: 11.6 % (ref 11.5–15.5)
WBC: 8.5 10*3/uL (ref 4.0–10.5)

## 2018-07-09 LAB — BASIC METABOLIC PANEL
Anion gap: 6 (ref 5–15)
BUN: 11 mg/dL (ref 6–20)
CALCIUM: 8.3 mg/dL — AB (ref 8.9–10.3)
CO2: 28 mmol/L (ref 22–32)
Chloride: 104 mmol/L (ref 98–111)
Creatinine, Ser: 0.8 mg/dL (ref 0.61–1.24)
GFR calc Af Amer: >60 mL/min (ref >60)
GFR calc non Af Amer: >60 mL/min (ref >60)
Glucose, Bld: 134 mg/dL — ABNORMAL HIGH (ref 70–99)
Potassium: 4 mmol/L (ref 3.5–5.1)
Sodium: 138 mmol/L (ref 135–145)

## 2018-07-09 NOTE — Discharge Instructions (Signed)

## 2018-07-09 NOTE — Progress Notes (Signed)
Subjective: 1 Day Post-Op Procedure(s) (LRB): RIGHT TOTAL HIP ARTHROPLASTY ANTERIOR APPROACH (Right) Patient reports pain as moderate.    Objective: Vital signs in last 24 hours: Temp:  [97.9 F (36.6 C)-99.3 F (37.4 C)] 98.4 F (36.9 C) (02/08 0624) Pulse Rate:  [61-82] 62 (02/08 0624) Resp:  [11-18] 16 (02/08 0624) BP: (107-162)/(66-95) 128/80 (02/08 0624) SpO2:  [93 %-98 %] 96 % (02/08 0624) Weight:  [93 kg-99 kg] 99 kg (02/07 1545)  Intake/Output from previous day: 02/07 0701 - 02/08 0700 In: 2497.3 [P.O.:480; I.V.:1967.3; IV Piggyback:50] Out: 1450 [Urine:1250; Blood:200] Intake/Output this shift: No intake/output data recorded.  Recent Labs    07/06/18 1437 07/09/18 0403  HGB 15.2 12.6*   Recent Labs    07/06/18 1437 07/09/18 0403  WBC 5.3 8.5  RBC 4.55 3.66*  HCT 45.3 38.4*  PLT 160 137*   Recent Labs    07/06/18 1437 07/09/18 0403  NA 138 138  K 3.8 4.0  CL 102 104  CO2 27 28  BUN 14 11  CREATININE 0.74 0.80  GLUCOSE 100* 134*  CALCIUM 9.6 8.3*   No results for input(s): LABPT, INR in the last 72 hours.  Sensation intact distally Intact pulses distally Dorsiflexion/Plantar flexion intact Incision: dressing C/D/I   Assessment/Plan: 1 Day Post-Op Procedure(s) (LRB): RIGHT TOTAL HIP ARTHROPLASTY ANTERIOR APPROACH (Right) Up with therapy  He would like to go home today, but he has not mobilized much with therapy.  He understands that we need to see how today goes before discharging him.  Patient's anticipated LOS is less than 2 midnights, meeting these requirements: - Younger than 95 - Lives within 1 hour of care - Has a competent adult at home to recover with post-op recover - NO history of  - Chronic pain requiring opiods  - Diabetes  - Coronary Artery Disease  - Heart failure  - Heart attack  - Stroke  - DVT/VTE  - Cardiac arrhythmia  - Respiratory Failure/COPD  - Renal failure  - Anemia  - Advanced Liver  disease       James Whitehead 07/09/2018, 8:02 AM

## 2018-07-09 NOTE — Progress Notes (Signed)
Physical Therapy Treatment Patient Details Name: James Whitehead MRN: [id number] DOB: 1955/10/03 Today's Date: 07/09/2018    History of Present Illness 63 yo male s/p R DA-THA on 07/08/18. PMH includes OA, HTN, L THA posterior approach 2014.    PT Comments    Pt with proficient stair navigation this session. Pt encouraged to have assist from friend/family for entering and exiting home initially for steadying RW and guarding, and pt states he will. Pt met all acute PT goals, LE exercises reviewed, practiced, and handout administered. Pt to d/c today.    Follow Up Recommendations  Follow surgeon's recommendation for DC plan and follow-up therapies;Supervision for mobility/OOB(HEP)     Equipment Recommendations  None recommended by PT    Recommendations for Other Services       Precautions / Restrictions Precautions Precautions: Fall Restrictions Weight Bearing Restrictions: No Other Position/Activity Restrictions: WBAT     Mobility  Bed Mobility               General bed mobility comments: Pt up in chair upon arrival, and left in chair upon PT exit.   Transfers Overall transfer level: Needs assistance Equipment used: Rolling walker (2 wheeled) Transfers: Sit to/from Stand Sit to Stand: Supervision         General transfer comment: supervision for safety, pt with good hand placement for rising.   Ambulation/Gait Ambulation/Gait assistance: Supervision Gait Distance (Feet): 130 Feet Assistive device: Rolling walker (2 wheeled) Gait Pattern/deviations: Trunk flexed;Step-through pattern;Decreased stride length Gait velocity: slightly decr    General Gait Details: supervision for safety. Verbal cuing for posture, cues for taking his time.    Stairs Stairs: Yes Stairs assistance: Min guard Stair Management: With walker;No rails;Forwards Number of Stairs: 2 General stair comments: Min guard for safety. Verbal cuing for ascending with nonsurgical LE, descending  with surgical LE. 2 steps attempted x2 to ensure pt proficiency.    Wheelchair Mobility    Modified Rankin (Stroke Patients Only)       Balance Overall balance assessment: Mild deficits observed, not formally tested                                          Cognition Arousal/Alertness: Awake/alert Behavior During Therapy: WFL for tasks assessed/performed Overall Cognitive Status: Within Functional Limits for tasks assessed                                        Exercises Total Joint Exercises Heel Slides: AROM;Right;10 reps;Standing Long Arc Quad: AROM;Right;10 reps;Seated Knee Flexion: AROM;Right;10 reps;Standing Marching in Standing: AROM;Right;10 reps;Standing    General Comments        Pertinent Vitals/Pain Pain Assessment: 0-10 Pain Score: 4  Pain Location: R hip  Pain Descriptors / Indicators: Sore;Aching Pain Intervention(s): Limited activity within patient's tolerance;Repositioned;Ice applied;Monitored during session;Premedicated before session    Home Living Family/patient expects to be discharged to:: Private residence Living Arrangements: Alone Available Help at Discharge: Family;Available PRN/intermittently         Home Equipment: Walker - 2 wheels;Cane - single point;Bedside commode Additional Comments: he has a reacher at home    Prior Function Level of Independence: Independent          PT Goals (current goals can now be found in the care plan section) Acute Rehab  PT Goals Patient Stated Goal: go home ASAP PT Goal Formulation: With patient Time For Goal Achievement: 07/15/18 Potential to Achieve Goals: Good Progress towards PT goals: Progressing toward goals    Frequency    7X/week      PT Plan Current plan remains appropriate    Co-evaluation              AM-PAC PT "6 Clicks" Mobility   Outcome Measure  Help needed turning from your back to your side while in a flat bed without using  bedrails?: None Help needed moving from lying on your back to sitting on the side of a flat bed without using bedrails?: None Help needed moving to and from a bed to a chair (including a wheelchair)?: None Help needed standing up from a chair using your arms (e.g., wheelchair or bedside chair)?: None Help needed to walk in hospital room?: A Little Help needed climbing 3-5 steps with a railing? : A Little 6 Click Score: 22    End of Session Equipment Utilized During Treatment: Gait belt Activity Tolerance: Patient tolerated treatment well;Patient limited by pain Patient left: with call bell/phone within reach;in chair Nurse Communication: Mobility status PT Visit Diagnosis: Other abnormalities of gait and mobility (R26.89);Difficulty in walking, not elsewhere classified (R26.2)     Time: 6728-9791 PT Time Calculation (min) (ACUTE ONLY): 18 min  Charges:  $Gait Training: 8-22 mins                    Julien Girt, PT Acute Rehabilitation Services Pager [phone]  Office [phone]    Roxine Caddy D Elonda Husky 07/09/2018, 3:46 PM

## 2018-07-09 NOTE — Care Management Note (Signed)
Case Management Note  Patient Details  Name: James Whitehead MRN: [id number] Date of Birth: 1956/01/13  Subjective/Objective:    Right THA                Action/Plan: NCM spoke to pt and offered choice for HH/Medicare List provided and placed on chart. Pt agreeable to [facility] for [place]. Has RW and 3n1 bedside commode at home.   Expected Discharge Date:  07/09/18               Expected Discharge Plan:  [place]  In-House Referral:  NA  Discharge planning Services  CM Consult  Post Acute Care Choice:  Home Health Choice offered to:  Patient  DME Arranged:  N/A DME Agency:  NA  HH Arranged:  PT Industry Agency:  [facility] (formerly [employer])  Status of Service:  Completed, signed off  If discussed at [employer], dates discussed:    Additional Comments:  Erenest Rasher, RN 07/09/2018, 2:31 PM

## 2018-07-09 NOTE — Progress Notes (Signed)
Physical Therapy Treatment Patient Details Name: James Whitehead MRN: [id number] DOB: Nov 29, 1955 Today's Date: 07/09/2018    History of Present Illness 63 yo male s/p R DA-THA on 07/08/18. PMH includes OA, HTN, L THA posterior approach 2014.    PT Comments    Pt with improved ambulation distance and gait characteristics this session, reporting moderate pain with mobility. Pt able to perform LE exercises in supine with AROM and AAROM. PT to see pt for second session to progress mobility and practice stair navigation. PT to continue to follow acutely.    Follow Up Recommendations  Follow surgeon's recommendation for DC plan and follow-up therapies;Supervision for mobility/OOB(HEP)     Equipment Recommendations  None recommended by PT    Recommendations for Other Services       Precautions / Restrictions Precautions Precautions: Fall Restrictions Weight Bearing Restrictions: No Other Position/Activity Restrictions: WBAT     Mobility  Bed Mobility Overal bed mobility: Needs Assistance Bed Mobility: Supine to Sit     Supine to sit: Min guard;HOB elevated     General bed mobility comments: Min assist for RLE translation to EOB. Pt with increased pain during supine to sit, 1 minute rest break at EOB to recover.   Transfers Overall transfer level: Needs assistance Equipment used: Rolling walker (2 wheeled) Transfers: Sit to/from Stand Sit to Stand: Min guard;From elevated surface         General transfer comment: Min guard for safety. Verbal cuing for hand placement when rising. Pt with self-steadying with RW upon standing.   Ambulation/Gait Ambulation/Gait assistance: Min guard;Supervision Gait Distance (Feet): 125 Feet Assistive device: Rolling walker (2 wheeled) Gait Pattern/deviations: Step-to pattern;Antalgic;Trunk flexed;Step-through pattern;Decreased stride length Gait velocity: decr    General Gait Details: Min guard to supervision for safety. Verbal cuing for  progression to step-through, smooth gait. Multiple verbal reinforcements for postural control.   Stairs             Wheelchair Mobility    Modified Rankin (Stroke Patients Only)       Balance Overall balance assessment: Mild deficits observed, not formally tested                                          Cognition Arousal/Alertness: Awake/alert Behavior During Therapy: WFL for tasks assessed/performed Overall Cognitive Status: Within Functional Limits for tasks assessed                                        Exercises Total Joint Exercises Ankle Circles/Pumps: AROM;Both;20 reps;Supine Quad Sets: AROM;Both;10 reps;Supine Heel Slides: AAROM;Right;10 reps;Supine Hip ABduction/ADduction: AAROM;Right;10 reps;Supine Straight Leg Raises: AAROM;Right;5 reps;Supine    General Comments        Pertinent Vitals/Pain Pain Assessment: 0-10 Pain Score: 5  Pain Location: R hip  Pain Descriptors / Indicators: Sore;Aching Pain Intervention(s): Limited activity within patient's tolerance;Repositioned;Ice applied;Monitored during session;Premedicated before session    Home Living                      Prior Function            PT Goals (current goals can now be found in the care plan section) Acute Rehab PT Goals Patient Stated Goal: go home ASAP PT Goal Formulation: With patient Time For Goal Achievement:  07/15/18 Potential to Achieve Goals: Good Progress towards PT goals: Progressing toward goals    Frequency    7X/week      PT Plan Current plan remains appropriate    Co-evaluation              AM-PAC PT "6 Clicks" Mobility   Outcome Measure  Help needed turning from your back to your side while in a flat bed without using bedrails?: A Little Help needed moving from lying on your back to sitting on the side of a flat bed without using bedrails?: A Little Help needed moving to and from a bed to a chair  (including a wheelchair)?: A Little Help needed standing up from a chair using your arms (e.g., wheelchair or bedside chair)?: A Little Help needed to walk in hospital room?: A Little Help needed climbing 3-5 steps with a railing? : A Little 6 Click Score: 18    End of Session Equipment Utilized During Treatment: Gait belt Activity Tolerance: Patient tolerated treatment well;Patient limited by pain Patient left: with call bell/phone within reach;with SCD's reapplied;in chair;with chair alarm set Nurse Communication: Mobility status PT Visit Diagnosis: Other abnormalities of gait and mobility (R26.89);Difficulty in walking, not elsewhere classified (R26.2)     Time: 6808-8110 PT Time Calculation (min) (ACUTE ONLY): 29 min  Charges:  $Gait Training: 8-22 mins $Therapeutic Exercise: 8-22 mins                     Julien Girt, PT Acute Rehabilitation Services Pager [phone]  Office [phone]   Angy Swearengin D Elonda Husky 07/09/2018, 11:44 AM

## 2018-07-09 NOTE — Discharge Summary (Signed)
Patient ID: James Whitehead MRN: [id number] DOB/AGE: 05-Jun-1955 63 y.o.  Admit date: 07/08/2018 Discharge date: 07/09/2018  Admission Diagnoses:  Principal Problem:   Unilateral primary osteoarthritis, right hip Active Problems:   Status post total replacement of right hip   Discharge Diagnoses:  Same  Past Medical History:  Diagnosis Date  . Arthritis   . Hypertension   . Left hydrocele   . Spermatocele    left    Surgeries: Procedure(s): RIGHT TOTAL HIP ARTHROPLASTY ANTERIOR APPROACH on 07/08/2018   Consultants:   Discharged Condition: Improved  Hospital Course: James Whitehead is an 63 y.o. male who was admitted 07/08/2018 for operative treatment ofUnilateral primary osteoarthritis, right hip. Patient has severe unremitting pain that affects sleep, daily activities, and work/hobbies. After pre-op clearance the patient was taken to the operating room on 07/08/2018 and underwent  Procedure(s): RIGHT TOTAL HIP ARTHROPLASTY ANTERIOR APPROACH.    Patient was given perioperative antibiotics:  Anti-infectives (From admission, onward)   Start     Dose/Rate Route Frequency Ordered Stop   07/08/18 1800  ceFAZolin (ANCEF) IVPB 1 g/50 mL premix     1 g 100 mL/hr over 30 Minutes Intravenous Every 6 hours 07/08/18 1545 07/08/18 2336   07/08/18 0900  ceFAZolin (ANCEF) IVPB 2g/100 mL premix     2 g 200 mL/hr over 30 Minutes Intravenous On call to O.R. 07/08/18 0845 07/08/18 1218       Patient was given sequential compression devices, early ambulation, and chemoprophylaxis to prevent DVT.  Patient benefited maximally from hospital stay and there were no complications.    Recent vital signs:  Patient Vitals for the past 24 hrs:  BP Temp Temp src Pulse Resp SpO2 Height Weight  07/09/18 0947 (!) 156/83 98.3 F (36.8 C) Oral 65 17 100 % - -  07/09/18 0624 128/80 98.4 F (36.9 C) Oral 62 16 96 % - -  07/09/18 0128 (!) 146/87 98.2 F (36.8 C) Oral 67 16 97 % - -  07/08/18 2131 127/72 98.7  F (37.1 C) Oral 69 16 98 % - -  07/08/18 1945 - - - 64 16 - - -  07/08/18 1837 (!) 153/83 98.6 F (37 C) Oral 76 14 97 % - -  07/08/18 1815 (!) 162/95 - - 82 - - - -  07/08/18 1748 (!) 162/95 98.5 F (36.9 C) Oral 80 14 98 % - -  07/08/18 1654 (!) 159/84 97.9 F (36.6 C) Oral 78 14 96 % - -  07/08/18 1545 140/82 98.8 F (37.1 C) Oral 79 15 98 % 6' (1.829 m) 99 kg  07/08/18 1515 124/66 99 F (37.2 C) - 67 14 95 % - -  07/08/18 1500 136/83 - - 71 16 97 % - -  07/08/18 1445 128/81 - - 76 16 98 % - -  07/08/18 1430 137/82 99.3 F (37.4 C) - 69 15 97 % - -  07/08/18 1415 129/80 - - 74 13 97 % - -  07/08/18 1400 107/84 - - 61 11 98 % - -  07/08/18 1345 (!) 158/75 - - 80 18 95 % - -  07/08/18 1328 109/67 98.6 F (37 C) - 78 15 93 % - -     Recent laboratory studies:  Recent Labs    07/06/18 1437 07/09/18 0403  WBC 5.3 8.5  HGB 15.2 12.6*  HCT 45.3 38.4*  PLT 160 137*  NA 138 138  K 3.8 4.0  CL 102 104  CO2 27 28  BUN 14 11  CREATININE 0.74 0.80  GLUCOSE 100* 134*  CALCIUM 9.6 8.3*     Discharge Medications:   Allergies as of 07/09/2018   No Known Allergies     Medication List    TAKE these medications   amLODipine 10 MG tablet Commonly known as:  NORVASC Take 10 mg by mouth daily.   ANTIOXIDANT PO Take 1 capsule by mouth daily.   ascorbic acid 1000 MG tablet Commonly known as:  VITAMIN C Take 2,000 mg by mouth daily.   aspirin 81 MG chewable tablet Commonly known as:  ASPIRIN CHILDRENS Chew 1 tablet (81 mg total) by mouth 2 (two) times daily after a meal.   carvedilol 6.25 MG tablet Commonly known as:  COREG Take 6.25 mg by mouth daily.   FISH OIL PO Take 2 capsules by mouth daily.   fluticasone 50 MCG/ACT nasal spray Commonly known as:  FLONASE Place 1 spray into both nostrils daily as needed (allergies.).   GLUCOSAMINE PO Take 1 tablet by mouth daily.   lisinopril 20 MG tablet Commonly known as:  PRINIVIL,ZESTRIL Take 20 mg by mouth  daily.   LUBRICANT EYE DROPS 0.4-0.3 % Soln Generic drug:  Polyethyl Glycol-Propyl Glycol Place 1 drop into both eyes 3 (three) times daily as needed (dry/irritated eyes.).   methocarbamol 500 MG tablet Commonly known as:  ROBAXIN Take 1 tablet (500 mg total) by mouth every 6 (six) hours as needed for muscle spasms.   MILK THISTLE PO Take 1-2 tablets by mouth See admin instructions. Take 2 tablets by mouth in the morning & 1 capsule by mouth at noon.   oxyCODONE 5 MG immediate release tablet Commonly known as:  ROXICODONE Take 1-2 tablets (5-10 mg total) by mouth every 4 (four) hours as needed.            Durable Medical Equipment  (From admission, onward)         Start     Ordered   07/08/18 1546  DME Walker rolling  Once    Question:  Patient needs a walker to treat with the following condition  Answer:  Status post total replacement of right hip   07/08/18 1545   07/08/18 1546  DME 3 n 1  Once     07/08/18 1545          Diagnostic Studies: Dg Pelvis Portable  Result Date: 07/08/2018 CLINICAL DATA:  Status post right total hip replacement. EXAM: PORTABLE PELVIS 1-2 VIEWS COMPARISON:  C-arm images obtained earlier today. FINDINGS: Bilateral total hip prostheses in satisfactory position and alignment. Postoperative soft tissue air on the right. No fracture or dislocation seen. Prostatic calcifications. IMPRESSION: Satisfactory postoperative appearance of a right total hip prosthesis. Electronically Signed   By: Claudie Revering M.D.   On: 07/08/2018 14:12   Dg C-arm 1-60 Min-no Report  Result Date: 07/08/2018 Fluoroscopy was utilized by the requesting physician.  No radiographic interpretation.   Dg Hip Operative Unilat With Pelvis Right  Result Date: 07/08/2018 CLINICAL DATA:  Total hip replacement. EXAM: OPERATIVE RIGHT HIP (WITH PELVIS IF PERFORMED) 2 VIEWS TECHNIQUE: Fluoroscopic spot image(s) were submitted for interpretation post-operatively. COMPARISON:  No prior.  FINDINGS: Total right hip replacement.  Hardware intact.  Anatomic alignment. IMPRESSION: Total right hip replacement with anatomic alignment. Electronically Signed   By: Marcello Moores  Register   On: 07/08/2018 13:29    Disposition: Discharge disposition: 01-Home or Self Care  Follow-up Information    Mcarthur Rossetti, MD Follow up in 2 week(s).   Specialty:  Orthopedic Surgery Contact information: [place] [address] [phone]        [facility].   Specialty:  [place] Why:  [place] will call to arrange initial visit Contact information: [place] [address] [phone]            Signed: Mcarthur Rossetti 07/09/2018, 1:02 PM

## 2018-07-09 NOTE — Evaluation (Signed)
Occupational Therapy Evaluation Patient Details Name: James Whitehead MRN: [id number] DOB: 1955/12/04 Today's Date: 07/09/2018    History of Present Illness 63 yo male s/p R DA-THA on 07/08/18. PMH includes OA, HTN, L THA posterior approach 2014.   Clinical Impression   Pt was admitted for the above.  At baseline, he is independent.  Pt needs mostly supervision/min guard at this time. Will follow in acute setting with supervision level goals    Follow Up Recommendations  Supervision - Intermittent    Equipment Recommendations  None recommended by OT    Recommendations for Other Services       Precautions / Restrictions Precautions Precautions: Fall Restrictions Weight Bearing Restrictions: No Other Position/Activity Restrictions: WBAT       Mobility Bed Mobility Overal bed mobility: Needs Assistance Bed Mobility: Supine to Sit     Supine to sit: Min guard;HOB elevated     General bed mobility comments: supervision in/out using gaitbelt  Transfers Overall transfer level: Needs assistance Equipment used: Rolling walker (2 wheeled) Transfers: Sit to/from Stand Sit to Stand: Supervision         General transfer comment: pt tends to move quickly    Balance Overall balance assessment: Mild deficits observed, not formally tested                                         ADL either performed or assessed with clinical judgement   ADL Overall ADL's : Needs assistance/impaired Eating/Feeding: Independent   Grooming: Set up;Sitting   Upper Body Bathing: Set up;Sitting   Lower Body Bathing: Supervison/ safety;Sit to/from stand;With adaptive equipment(reacher)   Upper Body Dressing : Set up;Sitting   Lower Body Dressing: (mod overall) Lower Body Dressing Details (indicate cue type and reason): supervision for pants/underwear with reacher.  Pt will have uncle assist with socks Toilet Transfer: Min guard;Ambulation;RW(simulated, bed)   Toileting-  Clothing Manipulation and Hygiene: Supervision/safety;Sit to/from stand         General ADL Comments: pt got dressed and used reacher, as he cannot comfortably reach to RLE.  Pt is very independent natured. He is unsure if he wants to go home today or not.  Used gaitbelt to get in/out of bed:  his bed is 36".  Recommended single sturdy stepstool.  He does have a couch he can sleep on, if he cannot do this.  Encouraged pt to ask uncle for assistance as needed so he doesn't overdo it or risk safety     Vision         Perception     Praxis      Pertinent Vitals/Pain Pain Assessment: 0-10 Pain Score: 4  Pain Location: R hip  Pain Descriptors / Indicators: Sore;Aching Pain Intervention(s): Limited activity within patient's tolerance;Monitored during session;Premedicated before session;Repositioned     Hand Dominance     Extremity/Trunk Assessment             Communication Communication Communication: No difficulties   Cognition Arousal/Alertness: Awake/alert Behavior During Therapy: WFL for tasks assessed/performed Overall Cognitive Status: Within Functional Limits for tasks assessed                                     General Comments       Exercises Total Joint Exercises Ankle Circles/Pumps: AROM;Both;20 reps;Supine Quad Sets:  AROM;Both;10 reps;Supine Heel Slides: AAROM;Right;10 reps;Supine Hip ABduction/ADduction: AAROM;Right;10 reps;Supine Straight Leg Raises: AAROM;Right;5 reps;Supine   Shoulder Instructions      Home Living Family/patient expects to be discharged to:: Private residence Living Arrangements: Alone Available Help at Discharge: Family;Available PRN/intermittently               Bathroom Shower/Tub: Tub/shower unit   Bathroom Toilet: Standard     Home Equipment: Environmental consultant - 2 wheels;Cane - single point;Bedside commode   Additional Comments: he has a reacher at home      Prior Functioning/Environment Level of  Independence: Independent                 OT Problem List: Decreased strength;Decreased activity tolerance;Pain;Decreased knowledge of use of DME or AE      OT Treatment/Interventions: Self-care/ADL training;DME and/or AE instruction;Patient/family education    OT Goals(Current goals can be found in the care plan section) Acute Rehab OT Goals Patient Stated Goal: go home ASAP OT Goal Formulation: With patient Time For Goal Achievement: 07/23/18 Potential to Achieve Goals: Good ADL Goals Additional ADL Goal #1: pt will gather clothes at mod I level and perform adl at this level (except for socks)  OT Frequency: Min 2X/week   Barriers to D/C:            Co-evaluation              AM-PAC OT "6 Clicks" Daily Activity     Outcome Measure Help from another person eating meals?: None Help from another person taking care of personal grooming?: A Little Help from another person toileting, which includes using toliet, bedpan, or urinal?: A Little Help from another person bathing (including washing, rinsing, drying)?: A Little Help from another person to put on and taking off regular upper body clothing?: A Little Help from another person to put on and taking off regular lower body clothing?: A Little 6 Click Score: 19   End of Session    Activity Tolerance: Patient tolerated treatment well Patient left: in chair;with call bell/phone within reach  OT Visit Diagnosis: Pain Pain - Right/Left: Right Pain - part of body: Hip                Time: 1137-1208 OT Time Calculation (min): 31 min Charges:  OT General Charges $OT Visit: 1 Visit OT Evaluation $OT Eval Low Complexity: 1 Low OT Treatments $Self Care/Home Management : 8-22 mins  Lesle Chris, OTR/L Acute Rehabilitation Services [phone] [facility] pager [phone] office 07/09/2018  [place] 07/09/2018, 1:00 PM

## 2018-07-09 NOTE — Plan of Care (Signed)
Plan of care reviewed and discussed with the patient. 

## 2018-07-09 NOTE — Progress Notes (Signed)
Patient ID: James Whitehead, male   DOB: [birth year], 63 y.o.   MRN: [id number] He is doing well with his mobility and prefers to go home this afternoon.

## 2018-07-11 ENCOUNTER — Telehealth (INDEPENDENT_AMBULATORY_CARE_PROVIDER_SITE_OTHER): Payer: Self-pay | Admitting: Orthopaedic Surgery

## 2018-07-11 NOTE — Telephone Encounter (Signed)
Verbal order given  

## 2018-07-11 NOTE — Telephone Encounter (Signed)
[facility]  [phone]    Verbal orders 3 wk 1  2 wk 1

## 2018-07-12 ENCOUNTER — Encounter (HOSPITAL_COMMUNITY): Payer: Self-pay | Admitting: Orthopaedic Surgery

## 2018-07-21 ENCOUNTER — Ambulatory Visit (INDEPENDENT_AMBULATORY_CARE_PROVIDER_SITE_OTHER): Payer: 59 | Admitting: Orthopaedic Surgery

## 2018-07-21 ENCOUNTER — Encounter (INDEPENDENT_AMBULATORY_CARE_PROVIDER_SITE_OTHER): Payer: Self-pay | Admitting: Orthopaedic Surgery

## 2018-07-21 DIAGNOSIS — Z96641 Presence of right artificial hip joint: Secondary | ICD-10-CM

## 2018-07-21 NOTE — Progress Notes (Signed)
The patient is 2 weeks status post a right total hip arthroplasty.  He is doing well.  He is not walking with an assistive device at all.  He is even ready return to work to [employer] duty.  He has been on aspirin twice a day and not taking any pain medication.  Notes from home therapy said he is doing well.  On exam his right hip incision looks great.  There is no seroma.  I remove the old Steri-Strips in place new ones.  His leg lengths are equal.  At this point he will stop his aspirin.  All question concerns were answered and addressed.  I did give him a work return note.  We will see him back in 4 weeks to see how is doing overall but no x-rays are needed.

## 2018-08-22 ENCOUNTER — Ambulatory Visit (INDEPENDENT_AMBULATORY_CARE_PROVIDER_SITE_OTHER): Payer: 59 | Admitting: Orthopaedic Surgery

## 2018-11-10 ENCOUNTER — Telehealth: Payer: Self-pay | Admitting: Orthopaedic Surgery

## 2018-11-10 NOTE — Telephone Encounter (Signed)
[name] from [facility] called stating patient is scheduled for a cleaning on Monday, 11/14/18.  James Whitehead states patient had a hip replacement in February and is requesting a return call with protocol on prescribing antibiotics.  [phone]

## 2018-11-10 NOTE — Telephone Encounter (Signed)
LMOM for them that patients don't need antibiotics after 3 months post op

## 2019-04-14 ENCOUNTER — Ambulatory Visit: Payer: Self-pay | Admitting: General Surgery

## 2019-04-20 ENCOUNTER — Encounter: Payer: 59 | Admitting: Diagnostic Neuroimaging

## 2019-04-21 ENCOUNTER — Other Ambulatory Visit (HOSPITAL_COMMUNITY)
Admission: RE | Admit: 2019-04-21 | Discharge: 2019-04-21 | Disposition: A | Discharge status: Home / Self Care | Payer: 59 | Source: Ambulatory Visit | Attending: General Surgery | Admitting: General Surgery

## 2019-04-21 DIAGNOSIS — Z20828 Contact with and (suspected) exposure to other viral communicable diseases: Secondary | ICD-10-CM | POA: Insufficient documentation

## 2019-04-21 DIAGNOSIS — Z01812 Encounter for preprocedural laboratory examination: Secondary | ICD-10-CM | POA: Diagnosis not present

## 2019-04-23 LAB — NOVEL CORONAVIRUS, NAA (HOSP ORDER, SEND-OUT TO REF LAB; TAT 18-24 HRS): SARS-CoV-2, NAA: NOT DETECTED

## 2019-04-24 ENCOUNTER — Other Ambulatory Visit: Payer: Self-pay

## 2019-04-24 ENCOUNTER — Encounter (HOSPITAL_BASED_OUTPATIENT_CLINIC_OR_DEPARTMENT_OTHER): Payer: Self-pay

## 2019-04-24 NOTE — Progress Notes (Signed)
SPOKE W/  James Whitehead     SCREENING SYMPTOMS OF COVID 19:   COUGH--NO  RUNNY NOSE--- NO  SORE THROAT---NO  NASAL CONGESTION----NO  SNEEZING----NO  SHORTNESS OF BREATH---NO  DIFFICULTY BREATHING---NO  TEMP >100.0 -----NO  UNEXPLAINED BODY ACHES------NO  CHILLS -------- NO  HEADACHES ---------NO  LOSS OF SMELL/ TASTE --------NO    HAVE YOU OR ANY FAMILY MEMBER TRAVELLED PAST 14 DAYS OUT OF THE   COUNTY---NO STATE----NO COUNTRY----NO  HAVE YOU OR ANY FAMILY MEMBER BEEN EXPOSED TO ANYONE WITH COVID 19? NO

## 2019-04-24 NOTE — Anesthesia Preprocedure Evaluation (Addendum)
Anesthesia Evaluation  Patient identified by MRN, date of birth, ID band Patient awake    Reviewed: Allergy & Precautions, NPO status , Patient's Chart, lab work & pertinent test results  Airway Mallampati: II  TM Distance: >3 FB Neck ROM: Full    Dental  (+) Dental Advisory Given   Pulmonary neg pulmonary ROS,    breath sounds clear to auscultation       Cardiovascular hypertension, Pt. on medications and Pt. on home beta blockers  Rhythm:Regular Rate:Normal     Neuro/Psych negative neurological ROS     GI/Hepatic negative GI ROS, Neg liver ROS,   Endo/Other  negative endocrine ROS  Renal/GU negative Renal ROS     Musculoskeletal  (+) Arthritis ,   Abdominal   Peds  Hematology negative hematology ROS (+)   Anesthesia Other Findings   Reproductive/Obstetrics                            Anesthesia Physical Anesthesia Plan  ASA: II  Anesthesia Plan: General   Post-op Pain Management:    Induction: Intravenous  PONV Risk Score and Plan: 2 and Dexamethasone, Ondansetron and Treatment may vary due to age or medical condition  Airway Management Planned: LMA and Oral ETT  Additional Equipment:   Intra-op Plan:   Post-operative Plan: Extubation in OR  Informed Consent: I have reviewed the patients History and Physical, chart, labs and discussed the procedure including the risks, benefits and alternatives for the proposed anesthesia with the patient or authorized representative who has indicated his/her understanding and acceptance.     Dental advisory given  Plan Discussed with: CRNA  Anesthesia Plan Comments:        Anesthesia Quick Evaluation

## 2019-04-24 NOTE — Progress Notes (Signed)
Spoke with: Antony Haste NPO:No food after midnight/Clear liquids until 4:30 AM DOS Arrival time:  0530 AM Labs: Istat * (EKG 07/2018) AM medications: Amlodipine, Carvedilol Pre op orders: YES Ride home: Ivin Booty (sister) [phone]

## 2019-04-25 ENCOUNTER — Encounter (HOSPITAL_BASED_OUTPATIENT_CLINIC_OR_DEPARTMENT_OTHER): Payer: Self-pay | Admitting: *Deleted

## 2019-04-25 ENCOUNTER — Other Ambulatory Visit: Payer: Self-pay

## 2019-04-25 ENCOUNTER — Ambulatory Visit (HOSPITAL_BASED_OUTPATIENT_CLINIC_OR_DEPARTMENT_OTHER): Payer: 59 | Admitting: Anesthesiology

## 2019-04-25 ENCOUNTER — Ambulatory Visit (HOSPITAL_BASED_OUTPATIENT_CLINIC_OR_DEPARTMENT_OTHER)
Admission: RE | Admit: 2019-04-25 | Discharge: 2019-04-25 | Disposition: A | Discharge status: Home / Self Care | Payer: 59 | Attending: General Surgery | Admitting: General Surgery

## 2019-04-25 ENCOUNTER — Encounter (HOSPITAL_BASED_OUTPATIENT_CLINIC_OR_DEPARTMENT_OTHER)
Admission: RE | Disposition: A | Discharge status: Home / Self Care | Payer: Self-pay | Source: Home / Self Care | Attending: General Surgery

## 2019-04-25 DIAGNOSIS — Z96643 Presence of artificial hip joint, bilateral: Secondary | ICD-10-CM | POA: Insufficient documentation

## 2019-04-25 DIAGNOSIS — Z7982 Long term (current) use of aspirin: Secondary | ICD-10-CM | POA: Insufficient documentation

## 2019-04-25 DIAGNOSIS — Z79899 Other long term (current) drug therapy: Secondary | ICD-10-CM | POA: Diagnosis not present

## 2019-04-25 DIAGNOSIS — Z87891 Personal history of nicotine dependence: Secondary | ICD-10-CM | POA: Diagnosis not present

## 2019-04-25 DIAGNOSIS — I10 Essential (primary) hypertension: Secondary | ICD-10-CM | POA: Insufficient documentation

## 2019-04-25 DIAGNOSIS — K429 Umbilical hernia without obstruction or gangrene: Secondary | ICD-10-CM | POA: Diagnosis not present

## 2019-04-25 HISTORY — PX: UMBILICAL HERNIA REPAIR: SHX196

## 2019-04-25 HISTORY — DX: Fatty (change of) liver, not elsewhere classified: K76.0

## 2019-04-25 HISTORY — DX: Localized edema: R60.0

## 2019-04-25 LAB — POCT I-STAT, CHEM 8
BUN: 17 mg/dL (ref 8–23)
Calcium, Ion: 1.24 mmol/L (ref 1.15–1.40)
Chloride: 100 mmol/L (ref 98–111)
Creatinine, Ser: 1 mg/dL (ref 0.61–1.24)
Glucose, Bld: 109 mg/dL — ABNORMAL HIGH (ref 70–99)
HCT: 41 % (ref 39.0–52.0)
Hemoglobin: 13.9 g/dL (ref 13.0–17.0)
Potassium: 3.9 mmol/L (ref 3.5–5.1)
Sodium: 141 mmol/L (ref 135–145)
TCO2: 26 mmol/L (ref 22–32)

## 2019-04-25 SURGERY — REPAIR, HERNIA, UMBILICAL, ADULT
Anesthesia: General | Site: Abdomen

## 2019-04-25 MED ORDER — ENSURE PRE-SURGERY PO LIQD
296.0000 mL | Freq: Once | ORAL | Status: DC
  Filled 2019-04-25: qty 296

## 2019-04-25 MED ORDER — IBUPROFEN 800 MG PO TABS: 800.0000 mg | ORAL_TABLET | Freq: Three times a day (TID) | ORAL | 0 refills | Status: AC | PRN

## 2019-04-25 MED ORDER — ACETAMINOPHEN 500 MG PO TABS
1000.0000 mg | ORAL_TABLET | ORAL | Status: AC
  Administered 2019-04-25: 1000 mg via ORAL
  Filled 2019-04-25: qty 2

## 2019-04-25 MED ORDER — GABAPENTIN 300 MG PO CAPS
ORAL_CAPSULE | ORAL | Status: AC
  Filled 2019-04-25: qty 1

## 2019-04-25 MED ORDER — KETOROLAC TROMETHAMINE 30 MG/ML IJ SOLN
INTRAMUSCULAR | Status: AC
  Filled 2019-04-25: qty 1

## 2019-04-25 MED ORDER — DEXAMETHASONE SODIUM PHOSPHATE 10 MG/ML IJ SOLN
INTRAMUSCULAR | Status: AC
  Filled 2019-04-25: qty 1

## 2019-04-25 MED ORDER — ONDANSETRON HCL 4 MG/2ML IJ SOLN
INTRAMUSCULAR | Status: DC | PRN
  Administered 2019-04-25: 4 mg via INTRAVENOUS

## 2019-04-25 MED ORDER — LACTATED RINGERS IV SOLN
INTRAVENOUS | Status: DC
  Administered 2019-04-25 (×2): via INTRAVENOUS
  Filled 2019-04-25: qty 1000

## 2019-04-25 MED ORDER — CHLORHEXIDINE GLUCONATE CLOTH 2 % EX PADS
6.0000 | MEDICATED_PAD | Freq: Once | CUTANEOUS | Status: DC
  Filled 2019-04-25: qty 6

## 2019-04-25 MED ORDER — ONDANSETRON HCL 4 MG/2ML IJ SOLN
4.0000 mg | Freq: Once | INTRAMUSCULAR | Status: DC | PRN
  Filled 2019-04-25: qty 2

## 2019-04-25 MED ORDER — GABAPENTIN 300 MG PO CAPS
300.0000 mg | ORAL_CAPSULE | ORAL | Status: AC
  Administered 2019-04-25: 300 mg via ORAL
  Filled 2019-04-25: qty 1

## 2019-04-25 MED ORDER — KETOROLAC TROMETHAMINE 15 MG/ML IJ SOLN
15.0000 mg | INTRAMUSCULAR | Status: DC
  Filled 2019-04-25: qty 1

## 2019-04-25 MED ORDER — DEXAMETHASONE SODIUM PHOSPHATE 4 MG/ML IJ SOLN
INTRAMUSCULAR | Status: DC | PRN
  Administered 2019-04-25: 5 mg via INTRAVENOUS

## 2019-04-25 MED ORDER — PROPOFOL 10 MG/ML IV BOLUS
INTRAVENOUS | Status: DC | PRN
  Administered 2019-04-25: 200 mg via INTRAVENOUS

## 2019-04-25 MED ORDER — LIDOCAINE 2% (20 MG/ML) 5 ML SYRINGE
INTRAMUSCULAR | Status: AC
  Filled 2019-04-25: qty 5

## 2019-04-25 MED ORDER — FENTANYL CITRATE (PF) 100 MCG/2ML IJ SOLN
INTRAMUSCULAR | Status: AC
  Filled 2019-04-25: qty 2

## 2019-04-25 MED ORDER — MIDAZOLAM HCL 5 MG/5ML IJ SOLN
INTRAMUSCULAR | Status: DC | PRN
  Administered 2019-04-25: 2 mg via INTRAVENOUS

## 2019-04-25 MED ORDER — FENTANYL CITRATE (PF) 100 MCG/2ML IJ SOLN
INTRAMUSCULAR | Status: DC | PRN
  Administered 2019-04-25: 25 ug via INTRAVENOUS
  Administered 2019-04-25: 50 ug via INTRAVENOUS
  Administered 2019-04-25: 25 ug via INTRAVENOUS

## 2019-04-25 MED ORDER — ACETAMINOPHEN 500 MG PO TABS
ORAL_TABLET | ORAL | Status: AC
  Filled 2019-04-25: qty 2

## 2019-04-25 MED ORDER — FENTANYL CITRATE (PF) 100 MCG/2ML IJ SOLN
25.0000 ug | INTRAMUSCULAR | Status: DC | PRN
  Filled 2019-04-25: qty 1

## 2019-04-25 MED ORDER — CEFAZOLIN SODIUM-DEXTROSE 2-4 GM/100ML-% IV SOLN
INTRAVENOUS | Status: AC
  Filled 2019-04-25: qty 100

## 2019-04-25 MED ORDER — LIDOCAINE HCL (CARDIAC) PF 100 MG/5ML IV SOSY
PREFILLED_SYRINGE | INTRAVENOUS | Status: DC | PRN
  Administered 2019-04-25: 100 mg via INTRAVENOUS

## 2019-04-25 MED ORDER — ONDANSETRON HCL 4 MG/2ML IJ SOLN
INTRAMUSCULAR | Status: AC
  Filled 2019-04-25: qty 2

## 2019-04-25 MED ORDER — MIDAZOLAM HCL 2 MG/2ML IJ SOLN
INTRAMUSCULAR | Status: AC
  Filled 2019-04-25: qty 2

## 2019-04-25 MED ORDER — CEFAZOLIN SODIUM-DEXTROSE 2-4 GM/100ML-% IV SOLN
2.0000 g | INTRAVENOUS | Status: AC
  Administered 2019-04-25: 2 g via INTRAVENOUS
  Filled 2019-04-25: qty 100

## 2019-04-25 MED ORDER — PROPOFOL 10 MG/ML IV BOLUS
INTRAVENOUS | Status: AC
  Filled 2019-04-25: qty 40

## 2019-04-25 MED ORDER — BUPIVACAINE LIPOSOME 1.3 % IJ SUSP
INTRAMUSCULAR | Status: DC | PRN
  Administered 2019-04-25: 50 mL

## 2019-04-25 MED ORDER — HYDROCODONE-ACETAMINOPHEN 5-325 MG PO TABS: 1.0000 | ORAL_TABLET | Freq: Four times a day (QID) | ORAL | 0 refills | Status: AC | PRN

## 2019-04-25 SURGICAL SUPPLY — 49 items
ADH SKN CLS APL DERMABOND .7 (GAUZE/BANDAGES/DRESSINGS) ×1
APL PRP STRL LF DISP 70% ISPRP (MISCELLANEOUS) ×1
BLADE HEX COATED 2.75 (ELECTRODE) ×2 IMPLANT
BLADE SURG 15 STRL LF DISP TIS (BLADE) ×1 IMPLANT
BLADE SURG 15 STRL SS (BLADE) ×2
BLADE SURG ROTATE 9660 (MISCELLANEOUS) IMPLANT
CANISTER SUCT 3000ML PPV (MISCELLANEOUS) ×1 IMPLANT
CELLS DAT CNTRL 66122 CELL SVR (MISCELLANEOUS) IMPLANT
CHLORAPREP W/TINT 26 (MISCELLANEOUS) ×2 IMPLANT
COVER BACK TABLE 60X90IN (DRAPES) ×2 IMPLANT
COVER MAYO STAND STRL (DRAPES) ×2 IMPLANT
COVER WAND RF STERILE (DRAPES) ×3 IMPLANT
DECANTER SPIKE VIAL GLASS SM (MISCELLANEOUS) ×1 IMPLANT
DERMABOND ADVANCED (GAUZE/BANDAGES/DRESSINGS) ×1
DERMABOND ADVANCED .7 DNX12 (GAUZE/BANDAGES/DRESSINGS) ×1 IMPLANT
DRAIN PENROSE 18X1/4 LTX STRL (WOUND CARE) ×1 IMPLANT
DRAPE LAPAROSCOPIC ABDOMINAL (DRAPES) ×2 IMPLANT
DRAPE UTILITY XL STRL (DRAPES) ×2 IMPLANT
ELECT REM PT RETURN 9FT ADLT (ELECTROSURGICAL) ×2
ELECTRODE REM PT RTRN 9FT ADLT (ELECTROSURGICAL) ×1 IMPLANT
GLOVE BIOGEL PI IND STRL 7.0 (GLOVE) ×1 IMPLANT
GLOVE BIOGEL PI INDICATOR 7.0 (GLOVE) ×1
GLOVE SURG SS PI 7.0 STRL IVOR (GLOVE) ×2 IMPLANT
GOWN STRL REUS W/TWL LRG LVL3 (GOWN DISPOSABLE) ×4 IMPLANT
KIT TURNOVER CYSTO (KITS) ×2 IMPLANT
MESH VENTRALEX ST 1-7/10 CRC S (Mesh General) ×1 IMPLANT
NEEDLE HYPO 25X1 1.5 SAFETY (NEEDLE) ×2 IMPLANT
NS IRRIG 500ML POUR BTL (IV SOLUTION) ×2 IMPLANT
PACK BASIN DAY SURGERY FS (CUSTOM PROCEDURE TRAY) ×2 IMPLANT
PENCIL BUTTON HOLSTER BLD 10FT (ELECTRODE) ×2 IMPLANT
RETRACTOR WND ALEXIS 18 MED (MISCELLANEOUS) IMPLANT
RTRCTR WOUND ALEXIS 18CM MED (MISCELLANEOUS)
RTRCTR WOUND ALEXIS 18CM SML (INSTRUMENTS)
SAVER CELL AAL HAEMONETICS (INSTRUMENTS) IMPLANT
SPONGE LAP 4X18 RFD (DISPOSABLE) ×2 IMPLANT
SUT MNCRL AB 4-0 PS2 18 (SUTURE) ×2 IMPLANT
SUT NOVA NAB GS-21 0 18 T12 DT (SUTURE) ×3 IMPLANT
SUT PDS AB 0 CT1 36 (SUTURE) IMPLANT
SUT PROLENE 0 CT 1 CR/8 (SUTURE) IMPLANT
SUT VIC AB 0 SH 27 (SUTURE) IMPLANT
SUT VIC AB 2-0 SH 27 (SUTURE)
SUT VIC AB 2-0 SH 27XBRD (SUTURE) IMPLANT
SUT VIC AB 3-0 SH 27 (SUTURE) ×2
SUT VIC AB 3-0 SH 27X BRD (SUTURE) ×1 IMPLANT
SYR BULB IRRIGATION 50ML (SYRINGE) ×2 IMPLANT
SYR CONTROL 10ML LL (SYRINGE) ×2 IMPLANT
TOWEL OR 17X26 10 PK STRL BLUE (TOWEL DISPOSABLE) ×3 IMPLANT
TUBE CONNECTING 12X1/4 (SUCTIONS) ×2 IMPLANT
YANKAUER SUCT BULB TIP NO VENT (SUCTIONS) ×2 IMPLANT

## 2019-04-25 NOTE — Anesthesia Procedure Notes (Signed)
Procedure Name: LMA Insertion Date/Time: 04/25/2019 7:39 AM Performed by: Justice Rocher, CRNA Pre-anesthesia Checklist: Patient identified, Emergency Drugs available, Suction available and Patient being monitored Patient Re-evaluated:Patient Re-evaluated prior to induction Oxygen Delivery Method: Circle system utilized Preoxygenation: Pre-oxygenation with 100% oxygen Induction Type: IV induction Ventilation: Mask ventilation without difficulty LMA: LMA inserted LMA Size: 5.0 Number of attempts: 1 Airway Equipment and Method: Bite block Placement Confirmation: positive ETCO2 and breath sounds checked- equal and bilateral Tube secured with: Tape Dental Injury: Teeth and Oropharynx as per pre-operative assessment

## 2019-04-25 NOTE — H&P (Signed)
James Whitehead is an 63 y.o. male.   Chief Complaint: hernia HPI: 62 yo male with sensitive and sometimes painful umbilical hernia. He denies nausea or vomiting. He has not been to the ER for reduction ever.  Past Medical History:  Diagnosis Date  . Arthritis   . Bilateral lower extremity edema   . Fatty liver   . Hypertension   . Left hydrocele   . Spermatocele    left    Past Surgical History:  Procedure Laterality Date  . COLONOSCOPY    . HYDROCELE EXCISION Left 03/01/2015   Procedure: HYDROCELECTOMY ADULT;  Surgeon: Alexis Frock, MD;  Location: [facility];  Service: Urology;  Laterality: Left;  . INGUINAL HERNIA REPAIR Bilateral 2006  . ORCHIOPEXY Left 03/01/2015   Procedure: ORCHIOPEXY ADULT;  Surgeon: Alexis Frock, MD;  Location: [facility];  Service: Urology;  Laterality: Left;  . TONSILLECTOMY  as child  . TOTAL HIP ARTHROPLASTY Left 12/28/2012   Procedure: TOTAL HIP ARTHROPLASTY- left;  Surgeon: Newt Minion, MD;  Location: [facility];  Service: Orthopedics;  Laterality: Left;  Left Total Hip Arthroplasty  . TOTAL HIP ARTHROPLASTY Right 07/08/2018   Procedure: RIGHT TOTAL HIP ARTHROPLASTY ANTERIOR APPROACH;  Surgeon: Mcarthur Rossetti, MD;  Location: [facility];  Service: Orthopedics;  Laterality: Right;    History reviewed. No pertinent family history. Social History:  reports that he has never smoked. He quit smokeless tobacco use about 42 years ago.  His smokeless tobacco use included chew. He reports current alcohol use of about 2.0 standard drinks of alcohol per week. He reports that he does not use drugs.  Allergies: No Known Allergies  Medications Prior to Admission  Medication Sig Dispense Refill  . amLODipine (NORVASC) 10 MG tablet Take 10 mg by mouth every morning.     Marland Kitchen ascorbic acid (VITAMIN C) 1000 MG tablet Take 2,000 mg by mouth every morning.     . carvedilol (COREG) 6.25 MG tablet Take 6.25 mg by mouth every morning.     .  cholecalciferol (VITAMIN D3) 25 MCG (1000 UT) tablet Take 1,000 Units by mouth as needed.    . Cyanocobalamin (VITAMIN B 12 PO) Take by mouth.    . furosemide (LASIX) 40 MG tablet Take 40 mg by mouth every morning.    . Glucosamine HCl (GLUCOSAMINE PO) Take 2 tablets by mouth every morning.     Marland Kitchen l-methylfolate-B6-B12 (METANX) 3-35-2 MG TABS tablet Take 1 tablet by mouth 2 (two) times daily.    Marland Kitchen lisinopril (PRINIVIL,ZESTRIL) 20 MG tablet Take 20 mg by mouth every morning.     Marland Kitchen MILK THISTLE PO Take 1-2 tablets by mouth See admin instructions. Take 2 tablets by mouth in the morning & 1 capsule by mouth at noon.    . Omega-3 Fatty Acids (FISH OIL PO) Take 2 capsules by mouth daily.    Vladimir Faster Glycol-Propyl Glycol (LUBRICANT EYE DROPS) 0.4-0.3 % SOLN Place 1 drop into both eyes 3 (three) times daily as needed (dry/irritated eyes.).    Marland Kitchen aspirin (ASPIRIN CHILDRENS) 81 MG chewable tablet Chew 1 tablet (81 mg total) by mouth 2 (two) times daily after a meal. (Patient not taking: Reported on 04/24/2019) 30 tablet 0  . fluticasone (FLONASE) 50 MCG/ACT nasal spray Place 1 spray into both nostrils daily as needed (allergies.).     Marland Kitchen methocarbamol (ROBAXIN) 500 MG tablet Take 1 tablet (500 mg total) by mouth every 6 (six) hours as needed  for muscle spasms. (Patient not taking: Reported on 04/24/2019) 40 tablet 0  . Multiple Vitamins-Minerals (ANTIOXIDANT PO) Take 1 capsule by mouth daily.    Marland Kitchen oxyCODONE (ROXICODONE) 5 MG immediate release tablet Take 1-2 tablets (5-10 mg total) by mouth every 4 (four) hours as needed. (Patient not taking: Reported on 04/24/2019) 40 tablet 0    Results for orders placed or performed during the hospital encounter of 04/25/19 (from the past 48 hour(s))  I-STAT, chem 8     Status: Abnormal   Collection Time: 04/25/19  6:26 AM  Result Value Ref Range   Sodium 141 135 - 145 mmol/L   Potassium 3.9 3.5 - 5.1 mmol/L   Chloride 100 98 - 111 mmol/L   BUN 17 8 - 23 mg/dL    Creatinine, Ser 1.00 0.61 - 1.24 mg/dL   Glucose, Bld 109 (H) 70 - 99 mg/dL   Calcium, Ion 1.24 1.15 - 1.40 mmol/L   TCO2 26 22 - 32 mmol/L   Hemoglobin 13.9 13.0 - 17.0 g/dL   HCT 41.0 39.0 - 52.0 %   No results found.  Review of Systems  Constitutional: Negative for chills and fever.  HENT: Negative for hearing loss.   Eyes: Negative for blurred vision and double vision.  Respiratory: Negative for cough and hemoptysis.   Cardiovascular: Negative for chest pain and palpitations.  Gastrointestinal: Positive for abdominal pain. Negative for nausea and vomiting.  Genitourinary: Negative for dysuria and urgency.  Musculoskeletal: Negative for myalgias and neck pain.  Skin: Negative for itching and rash.  Neurological: Negative for dizziness, tingling and headaches.  Endo/Heme/Allergies: Does not bruise/bleed easily.  Psychiatric/Behavioral: Negative for depression and suicidal ideas.    Blood pressure (!) 135/91, pulse 79, temperature 98.1 F (36.7 C), temperature source Oral, resp. rate 16, height 6' (1.829 m), weight 93.5 kg, SpO2 100 %. Physical Exam  Vitals reviewed. Constitutional: He is oriented to person, place, and time. He appears well-developed and well-nourished.  HENT:  Head: Normocephalic and atraumatic.  Eyes: Pupils are equal, round, and reactive to light. Conjunctivae and EOM are normal.  Neck: Normal range of motion.  Cardiovascular: Normal rate and regular rhythm.  Respiratory: Effort normal.  GI: Soft. Bowel sounds are normal. He exhibits no distension. There is no abdominal tenderness.  Moderate umbilical hernia  Musculoskeletal: Normal range of motion.  Neurological: He is alert and oriented to person, place, and time.  Skin: Skin is warm and dry.  Psychiatric: He has a normal mood and affect. His behavior is normal.     Assessment/Plan 63 yo male with moderate sized symptomatic umbilical hernia -open umbilical hernia repair with mesh -[facility]AS  protocol -planned outpatient procedure  Mickeal Skinner, MD 04/25/2019, 7:23 AM

## 2019-04-25 NOTE — Discharge Instructions (Signed)
°  Post Anesthesia Home Care Instructions  Activity: Get plenty of rest for the remainder of the day. A responsible adult should stay with you for 24 hours following the procedure.  For the next 24 hours, DO NOT: -Drive a car -Operate machinery -Drink alcoholic beverages -Take any medication unless instructed by your physician -Make any legal decisions or sign important papers.  Meals: Start with liquid foods such as gelatin or soup. Progress to regular foods as tolerated. Avoid greasy, spicy, heavy foods. If nausea and/or vomiting occur, drink only clear liquids until the nausea and/or vomiting subsides. Call your physician if vomiting continues.  Special Instructions/Symptoms: Your throat may feel dry or sore from the anesthesia or the breathing tube placed in your throat during surgery. If this causes discomfort, gargle with warm salt water. The discomfort should disappear within 24 hours.  If you had a scopolamine patch placed behind your ear for the management of post- operative nausea and/or vomiting:  1. The medication in the patch is effective for 72 hours, after which it should be removed.  Wrap patch in a tissue and discard in the trash. Wash hands thoroughly with soap and water. 2. You may remove the patch earlier than 72 hours if you experience unpleasant side effects which may include dry mouth, dizziness or visual disturbances. 3. Avoid touching the patch. Wash your hands with soap and water after contact with the patch.   Information for Discharge Teaching: EXPAREL (bupivacaine liposome injectable suspension)   Your surgeon gave you EXPAREL(bupivacaine) in your surgical incision to help control your pain after surgery.   EXPAREL is a local anesthetic that provides pain relief by numbing the tissue around the surgical site.  EXPAREL is designed to release pain medication over time and can control pain for up to 72 hours.  Depending on how you respond to EXPAREL, you may  require less pain medication during your recovery.  Possible side effects:  Temporary loss of sensation or ability to move in the area where bupivacaine was injected.  Nausea, vomiting, constipation  Rarely, numbness and tingling in your mouth or lips, lightheadedness, or anxiety may occur.  Call your doctor right away if you think you may be experiencing any of these sensations, or if you have other questions regarding possible side effects.  Follow all other discharge instructions given to you by your surgeon or nurse. Eat a healthy diet and drink plenty of water or other fluids.  If you return to the hospital for any reason within 96 hours following the administration of EXPAREL, please inform your health care providers. 

## 2019-04-25 NOTE — Transfer of Care (Signed)
Immediate Anesthesia Transfer of Care Note  Patient: James Whitehead  Procedure(s) Performed: Procedure(s) (LRB): OPEN UMBILICAL HERNIA REPAIR WITH MESH (N/A)  Patient Location: PACU  Anesthesia Type: General  Level of Consciousness: awake, sedated, patient cooperative and responds to stimulation  Airway & Oxygen Therapy: Patient Spontanous Breathing and Patient connected to [place] 02 and soft FM  Post-op Assessment: Report given to PACU RN, Post -op Vital signs reviewed and stable and Patient moving all extremities  Post vital signs: Reviewed and stable  Complications: No apparent anesthesia complications

## 2019-04-25 NOTE — Op Note (Signed)
PATIENT:  James Whitehead  63 y.o. male  PRE-OPERATIVE DIAGNOSIS:  UMBILICAL HERNIA  POST-OPERATIVE DIAGNOSIS:  UMBILICAL HERNIA  PROCEDURE:  Procedure(s): OPEN UMBILICAL HERNIA REPAIR WITH MESH  SURGEON:  Surgeon(s): Isay Perleberg, Arta Bruce, MD  ASSISTANT: none   ANESTHESIA:   local and general  Indications for procedure: [name] is a 63 y.o. year old male with symptoms of umbilical hernia with pain.  Description of procedure: The patient was brought into the operative suite. Anesthesia was administered with General LMA anesthesia. WHO checklist was applied. The patient was then placed in supine. The area was prepped and draped in the usual sterile fashion.  Next the infraumbilical skin was anesthetized with Exparel:Marcaine. A semilunar infraumbilical incision was made. Cautery and blunt dissection was used to dissect down to the fascia. The hernia sac was dissected free from surrounding tissues in 360 degrees. The umbilical skin was dissected free of the hernia sac with cautery. There was a small amount of omentum within the hernia. The contents of the hernia sac were reduced. The hernia defect was 2.5 cm in diameter. The hernia sac was removed. Due to the size of the hernia, a 4 cm ventralex mesh was placed as an underlay using 4 0 prolene in 4 directions to anchor the mesh. The fascial defect was then primarily closed with interrupted 0 prolene sutures. The umbilical skin was sutured to the fascia with a 3-0 vicryl. The deep dermal space was closed with a 3-0 vicryl. Exparel:Marcaine was injected into the muscle layer and around the fascia. The skin was closed with a 4-0 monocryl subcuticular suture. Dermabond was put in place for dressing. The patient awoke from anesthesia and was brought to pacu in stable condition. All counts were correct.  Findings: 2.5 cm umbilical hernia  Specimen: none  Blood loss: 10 ml  Local anesthesia: 50 ml Exparel: Marcaine mix  Complications:  none  PLAN OF CARE: Discharge to home after PACU  PATIENT DISPOSITION:  PACU - hemodynamically stable.  Gurney Maxin, M.D. General, Bariatric, & Minimally Invasive Surgery [facility], [place]  04/25/2019 8:28 AM

## 2019-04-26 ENCOUNTER — Encounter (HOSPITAL_BASED_OUTPATIENT_CLINIC_OR_DEPARTMENT_OTHER): Payer: Self-pay | Admitting: General Surgery

## 2019-04-26 NOTE — Anesthesia Postprocedure Evaluation (Signed)
Anesthesia Post Note  Patient: James Whitehead  Procedure(s) Performed: OPEN UMBILICAL HERNIA REPAIR WITH MESH (N/A Abdomen)     Patient location during evaluation: PACU Anesthesia Type: General Level of consciousness: awake and alert Pain management: pain level controlled Vital Signs Assessment: post-procedure vital signs reviewed and stable Respiratory status: spontaneous breathing, nonlabored ventilation, respiratory function stable and patient connected to nasal cannula oxygen Cardiovascular status: blood pressure returned to baseline and stable Postop Assessment: no apparent nausea or vomiting Anesthetic complications: no    Last Vitals:  Vitals:   04/25/19 0930 04/25/19 1015  BP:  (!) 148/87  Pulse: 74 83  Resp: 12 13  Temp: 37.2 C 36.8 C  SpO2: 94% 96%    Last Pain:  Vitals:   04/25/19 1015  TempSrc:   PainSc: 0-No pain                 Tiajuana Amass

## 2019-05-02 ENCOUNTER — Ambulatory Visit: Payer: 59 | Admitting: Neurology

## 2019-06-15 ENCOUNTER — Encounter: Payer: Self-pay | Admitting: Neurology

## 2019-06-15 ENCOUNTER — Other Ambulatory Visit: Payer: Self-pay

## 2019-06-15 ENCOUNTER — Ambulatory Visit: Payer: Managed Care, Other (non HMO) | Admitting: Neurology

## 2019-06-15 VITALS — BP 131/72 | HR 81 | Temp 95.7°F | Ht 72.0 in | Wt 207.0 lb

## 2019-06-15 DIAGNOSIS — M5442 Lumbago with sciatica, left side: Secondary | ICD-10-CM

## 2019-06-15 DIAGNOSIS — R200 Anesthesia of skin: Secondary | ICD-10-CM

## 2019-06-15 DIAGNOSIS — Z789 Other specified health status: Secondary | ICD-10-CM

## 2019-06-15 DIAGNOSIS — R748 Abnormal levels of other serum enzymes: Secondary | ICD-10-CM

## 2019-06-15 DIAGNOSIS — G609 Hereditary and idiopathic neuropathy, unspecified: Secondary | ICD-10-CM | POA: Diagnosis not present

## 2019-06-15 DIAGNOSIS — R609 Edema, unspecified: Secondary | ICD-10-CM

## 2019-06-15 DIAGNOSIS — G8929 Other chronic pain: Secondary | ICD-10-CM

## 2019-06-15 NOTE — Progress Notes (Signed)
Subjective:    Patient ID: James Whitehead is a 64 y.o. male.  HPI     Star Age, MD, PhD [facility] [address], [address] [place], [place] [address]  Dear Dr. Maudie Mercury,   I saw your patient, James Whitehead, upon your kind request to my neurologic clinic today for initial consultation of his lower extremity numbness, concern for neuropathy. The patient is unaccompanied today. As you know, James Whitehead is a 64 year old right-handed gentleman with an underlying medical history of hypertension, low back pain, elevated liver enzymes, osteoarthritis, lower extremity edema and mildly overweight state, who reports pain and numbness in his feet for the past nearly 2 years.  He reports that he had hurt his back in January 2019 and initially had no obvious symptoms but started having a pain around the left ankle about 2 months later and shortly thereafter his right foot started hurting.  He does report a more chronic history of low back pain off and on, has never seen a spine specialist.  He has received an injection into his lower back with marginal results.  He had a right total hip replacement in January last year and a left total hip replacement in 2014.  He has noticed lower extremity swelling for the past few months.  He started a muscle relaxer in March 2019 and takes it infrequently.  He started Cymbalta in November 2020 but only took 1 pill and felt he could not tolerate it.  He has been on Metanx for the past 3 months or so since October 2020. I reviewed your office records.  He reports that he gets blood work every 6 months.  He denies any family history of neuropathy.  He reports that the pain is numbness is primarily at the bottom of his feet, balls of his feet and toes.  He has not noticed any significant improvement in his symptoms since starting the Metanx.  He tries to hydrate well with water.  He does not smoke, he drinks alcohol daily in the form of 2 drinks of cola  and rum and intermittently in the past has had higher consumption of alcohol.  He has never been told that he was diabetic or prediabetic.  His lower extremity swelling typically is down first thing in the morning.  He feels that soaking his feet in cold water has also been helpful.  His Past Medical History Is Significant For: Past Medical History:  Diagnosis Date  . Arthritis   . Bilateral lower extremity edema   . Fatty liver   . Hypertension   . Left hydrocele   . Spermatocele    left    His Past Surgical History Is Significant For: Past Surgical History:  Procedure Laterality Date  . COLONOSCOPY    . HYDROCELE EXCISION Left 03/01/2015   Procedure: HYDROCELECTOMY ADULT;  Surgeon: Alexis Frock, MD;  Location: [facility];  Service: Urology;  Laterality: Left;  . INGUINAL HERNIA REPAIR Bilateral 2006  . ORCHIOPEXY Left 03/01/2015   Procedure: ORCHIOPEXY ADULT;  Surgeon: Alexis Frock, MD;  [facility];  Service: Urology;  Laterality: Left;  . TONSILLECTOMY  as child  . TOTAL HIP ARTHROPLASTY Left 12/28/2012   Procedure: TOTAL HIP ARTHROPLASTY- left;  Surgeon: Newt Minion, MD;  Location: [facility];  Service: Orthopedics;  Laterality: Left;  Left Total Hip Arthroplasty  . TOTAL HIP ARTHROPLASTY Right 07/08/2018   Procedure: RIGHT TOTAL HIP ARTHROPLASTY ANTERIOR APPROACH;  Surgeon: Mcarthur Rossetti, MD;  Location: [facility];  Service: Orthopedics;  Laterality: Right;  . UMBILICAL HERNIA REPAIR N/A 04/25/2019   Procedure: OPEN UMBILICAL HERNIA REPAIR WITH MESH;  Surgeon: Kinsinger, Arta Bruce, MD;  Location: [facility];  Service: General;  Laterality: N/A;    His Family History Is Significant For: Family History  Problem Relation Age of Onset  . Lung cancer Mother     His Social History Is Significant For: Social History   Socioeconomic History  . Marital status: Single    Spouse name: Not on file  . Number of children:  Not on file  . Years of education: Not on file  . Highest education level: Not on file  Occupational History  . Not on file  Tobacco Use  . Smoking status: Never Smoker  . Smokeless tobacco: Former Systems developer    Types: Chew  Substance and Sexual Activity  . Alcohol use: Yes    Alcohol/week: 2.0 standard drinks    Types: 2 Cans of beer per week    Comment: every day   . Drug use: No  . Sexual activity: Yes  Other Topics Concern  . Not on file  Social History Narrative  . Not on file   Social Determinants of Health   Financial Resource Strain:   . Difficulty of Paying Living Expenses: Not on file  Food Insecurity:   . Worried About Charity fundraiser in the Last Year: Not on file  . Ran Out of Food in the Last Year: Not on file  Transportation Needs:   . Lack of Transportation (Medical): Not on file  . Lack of Transportation (Non-Medical): Not on file  Physical Activity:   . Days of Exercise per Week: Not on file  . Minutes of Exercise per Session: Not on file  Stress:   . Feeling of Stress : Not on file  Social Connections:   . Frequency of Communication with Friends and Family: Not on file  . Frequency of Social Gatherings with Friends and Family: Not on file  . Attends Religious Services: Not on file  . Active Member of Clubs or Organizations: Not on file  . Attends Archivist Meetings: Not on file  . Marital Status: Not on file    His Allergies Are:  No Known Allergies:   His Current Medications Are:  Outpatient Encounter Medications as of 06/15/2019  Medication Sig  . amLODipine (NORVASC) 10 MG tablet Take 10 mg by mouth every morning.   Marland Kitchen ascorbic acid (VITAMIN C) 1000 MG tablet Take 2,000 mg by mouth every morning.   Marland Kitchen aspirin (ASPIRIN CHILDRENS) 81 MG chewable tablet Chew 1 tablet (81 mg total) by mouth 2 (two) times daily after a meal.  . carvedilol (COREG) 6.25 MG tablet Take 6.25 mg by mouth every morning.   . cholecalciferol (VITAMIN D3) 25 MCG  (1000 UT) tablet Take 1,000 Units by mouth as needed.  . Cyanocobalamin (VITAMIN B 12 PO) Take by mouth.  . fluticasone (FLONASE) 50 MCG/ACT nasal spray Place 1 spray into both nostrils daily as needed (allergies.).   Marland Kitchen furosemide (LASIX) 40 MG tablet Take 40 mg by mouth every morning.  . Glucosamine HCl (GLUCOSAMINE PO) Take 2 tablets by mouth every morning.   Marland Kitchen l-methylfolate-B6-B12 (METANX) 3-35-2 MG TABS tablet Take 1 tablet by mouth 2 (two) times daily.  Marland Kitchen lisinopril (PRINIVIL,ZESTRIL) 20 MG tablet Take 20 mg by mouth every morning.   Marland Kitchen MILK THISTLE  PO Take 1-2 tablets by mouth See admin instructions. Take 2 tablets by mouth in the morning & 1 capsule by mouth at noon.  . Multiple Vitamins-Minerals (ANTIOXIDANT PO) Take 1 capsule by mouth daily.  . Omega-3 Fatty Acids (FISH OIL PO) Take 2 capsules by mouth daily.  Vladimir Faster Glycol-Propyl Glycol (LUBRICANT EYE DROPS) 0.4-0.3 % SOLN Place 1 drop into both eyes 3 (three) times daily as needed (dry/irritated eyes.).  Marland Kitchen HYDROcodone-acetaminophen (NORCO/VICODIN) 5-325 MG tablet Take 1 tablet by mouth every 6 (six) hours as needed for moderate pain. (Patient not taking: Reported on 06/15/2019)  . ibuprofen (ADVIL) 800 MG tablet Take 1 tablet (800 mg total) by mouth every 8 (eight) hours as needed. (Patient not taking: Reported on 06/15/2019)  . methocarbamol (ROBAXIN) 500 MG tablet Take 1 tablet (500 mg total) by mouth every 6 (six) hours as needed for muscle spasms. (Patient not taking: Reported on 06/15/2019)  . [DISCONTINUED] oxyCODONE (ROXICODONE) 5 MG immediate release tablet Take 1-2 tablets (5-10 mg total) by mouth every 4 (four) hours as needed. (Patient not taking: Reported on 04/24/2019)   No facility-administered encounter medications on file as of 06/15/2019.  :  Review of Systems:  Out of a complete 14 point review of systems, all are reviewed and negative with the exception of these symptoms as listed below:  Review of Systems   Neurological:       Here to discuss neuropathy pain in bilateral feet. Pt reports sx are getting progressively worse.    Objective:  Neurological Exam  Physical Exam Physical Examination:   Vitals:   06/15/19 1425  BP: 131/72  Pulse: 81  Temp: (!) 95.7 F (35.4 C)   General Examination: The patient is a very pleasant 64 y.o. male in no acute distress. He appears well-developed and well-nourished and adequately groomed.   HEENT: Normocephalic, atraumatic, pupils are equal, round and reactive to light and accommodation, He wears corrective eyeglasses, mild cataracts are noted bilaterally.  Extraocular tracking is well preserved.  Hearing is grossly intact. Face is symmetric with normal facial animation and normal facial sensation. Speech is clear with no dysarthria noted. There is no hypophonia. There is no lip, neck/head, jaw or voice tremor. Neck is supple with full range of passive and active motion. There are no carotid bruits on auscultation. Oropharynx exam reveals: moderate mouth dryness, adequate dental hygiene. Tongue protrudes centrally and palate elevates symmetrically.   Chest: Clear to auscultation without wheezing, rhonchi or crackles noted.  Heart: S1+S2+0, regular and normal without murmurs, rubs or gallops noted.   Abdomen: Soft, non-tender and non-distended with normal bowel sounds appreciated on auscultation.  Extremities: There is trace pitting edema in the distal lower extremities bilaterally. Pedal pulses are intact.  Skin: Warm and dry without trophic changes noted. There are no varicose veins.  Musculoskeletal: exam reveals no obvious joint deformities, tenderness or joint swelling or erythema.   Neurologically:  Mental status: The patient is awake, alert and oriented in all 4 spheres. His immediate and remote memory, attention, language skills and fund of knowledge are appropriate. There is no evidence of aphasia, agnosia, apraxia or anomia. Speech is clear  with normal prosody and enunciation. Thought process is linear. Mood is normal and affect is normal.  Cranial nerves II - XII are as described above under HEENT exam. In addition: shoulder shrug is normal with equal shoulder height noted. Motor exam: Normal bulk, strength and tone is noted. There is no drift, tremor or rebound. Reflexes  are 2+ In the upper extremities and knees, 1+ in the ankles. Babinski: Toes are flexor bilaterally. Fine motor skills and coordination: intact with normal finger taps, normal hand movements, normal rapid alternating patting, normal foot taps and normal foot agility.  Cerebellar testing: No dysmetria or intention tremor on finger to nose testing. Heel to shin is unremarkable bilaterally. There is no truncal or gait ataxia.  Sensory exam: intact to light touch, pinprick, vibration, temperature sense In the upper extremities, he has decreased sensation to temperature and pinprick in the distal lower extremities, up to almost mid calf areas bilaterally, left side slightly worse than right, relatively well preserved vibration sense in the lower extremities. Gait, station and balance: He stands easily. No veering to one side is noted. No leaning to one side is noted. Posture is age-appropriate and stance is narrow based. Gait shows normal stride length and normal pace. No problems turning are noted.              Assessment and Plan:  In summary, James Whitehead is a very pleasant 64 y.o.-year old male with an underlying medical history of hypertension, low back pain, elevated liver enzymes, osteoarthritis, lower extremity edema and mildly overweight state, who Presents for evaluation of his bilateral foot numbness and pain.  He has on examination decreased sensation to pinprick and temperature sense in the distal lower extremities up to approximately mid calf areas.  He does have preserved reflexes in both lower extremities, he has no muscle weakness, no sensory deficit in the upper  extremities. His history and examination are concerning for peripheral neuropathy.  He also reports a longstanding history of low back pain, felt that it got worse when he had an injury to the back in January 2019 and had some left-sided symptoms a couple of months later.  He has never seen a spine specialist.  He is advised to talk to you about potentially seeing a spine specialist such as orthopedic surgery or neurosurgery. I would like to proceed with further diagnostic evaluation in the form of blood work and electrophysiological testing in the form of EMG and nerve conduction velocity testing through our office.  We will call him with his blood test results.  We will also keep him posted as to his EMG/nerve conduction velocity test results by phone call and proceed from there. I have talked to the patient regarding his daily alcohol consumption.  While he reports drinking 2 drinks of alcohol per day, I did point out to him that chronic/daily alcohol consumption can contribute to neuropathy risk and he is encouraged to reduce and if possible eliminate his daily alcohol use.In addition, his medical records indicate that he has had elevated liver enzymes before.  He is aware of this. For now, we will proceed with blood work and EMG testing.  I answered all his questions today and he was in agreement.  Thank you very much for allowing me to participate in the care of this nice patient. If I can be of any further assistance to you please do not hesitate to call me at [phone].  Sincerely,   Star Age, MD, PhD

## 2019-06-15 NOTE — Patient Instructions (Signed)
You may have a condition called peripheral neuropathy, i. e. nerve damage. Unfortunately, there is no specific treatment for most neuropathies. The most common cause for neuropathy is diabetes in this country, in which case, tight glucose control is key. Other causes include thyroid disease, and some vitamin deficiencies. Certain medications such as chemotherapy agents and other chemicals or toxins including alcohol can cause neuropathy. There are some genetic conditions or hereditary neuropathies. Typically patients will report a family history of neuropathy in those conditions. There are cases associated with cancers and autoimmune conditions. Most neuropathies are progressive unless a root cause can be found and treated.  For most neuropathies there is no actual cure or reversing of symptoms. Painful neuropathy can be difficult to treat symptomatically, but there are some medications available to ease the symptoms. Electrophysiologic testing with nerve conduction velocity studies and EMG (muscle testing) do not always pick up neuropathies that affect the smallest of fibers. Other common tests include different type of blood work, and rarely, spinal fluid testing, and sometimes we resort to asking for a nerve and muscle biopsy.  For now, I recommend that we proceed with blood work, as well as an Dealer nerve and muscle test called EMG/nerve conduction velocity testing which we will schedule in our office.  We will do blood work today and call you with the results.  We will take it from there.  You do have a history of chronic back pain, you may benefit from seeing a spine specialist such as an orthopedic surgeon or neurosurgeon.  Please talk to Dr. Maudie Mercury about a referral.  I recommend that you reduce and if possible eliminate your daily alcohol intake as alcohol can be damaging to nerve endings.

## 2019-06-20 LAB — COMPREHENSIVE METABOLIC PANEL
ALT: 22 IU/L (ref 0–44)
AST: 21 IU/L (ref 0–40)
Albumin/Globulin Ratio: 2 (ref 1.2–2.2)
Albumin: 4.9 g/dL — ABNORMAL HIGH (ref 3.8–4.8)
Alkaline Phosphatase: 67 IU/L (ref 39–117)
BUN/Creatinine Ratio: 18 (ref 10–24)
BUN: 24 mg/dL (ref 8–27)
Bilirubin Total: 0.8 mg/dL (ref 0.0–1.2)
CO2: 26 mmol/L (ref 20–29)
Calcium: 10.5 mg/dL — ABNORMAL HIGH (ref 8.6–10.2)
Chloride: 96 mmol/L (ref 96–106)
Creatinine, Ser: 1.35 mg/dL — ABNORMAL HIGH (ref 0.76–1.27)
GFR calc Af Amer: 65 mL/min/{1.73_m2} (ref >59)
GFR calc non Af Amer: 56 mL/min/{1.73_m2} — ABNORMAL LOW (ref >59)
Globulin, Total: 2.5 g/dL (ref 1.5–4.5)
Glucose: 95 mg/dL (ref 65–99)
Potassium: 4.4 mmol/L (ref 3.5–5.2)
Sodium: 137 mmol/L (ref 134–144)
Total Protein: 7.4 g/dL (ref 6.0–8.5)

## 2019-06-20 LAB — MULTIPLE MYELOMA PANEL, SERUM
Albumin SerPl Elph-Mcnc: 4.2 g/dL (ref 2.9–4.4)
Albumin/Glob SerPl: 1.4 (ref 0.7–1.7)
Alpha 1: 0.2 g/dL (ref 0.0–0.4)
Alpha2 Glob SerPl Elph-Mcnc: 0.8 g/dL (ref 0.4–1.0)
B-Globulin SerPl Elph-Mcnc: 1.2 g/dL (ref 0.7–1.3)
Gamma Glob SerPl Elph-Mcnc: 1 g/dL (ref 0.4–1.8)
Globulin, Total: 3.2 g/dL (ref 2.2–3.9)
IgA/Immunoglobulin A, Serum: 406 mg/dL (ref 61–437)
IgG (Immunoglobin G), Serum: 931 mg/dL (ref 603–1613)
IgM (Immunoglobulin M), Srm: 84 mg/dL (ref 20–172)

## 2019-06-20 LAB — VITAMIN B6: Vitamin B6: 87 ug/L — ABNORMAL HIGH (ref 5.3–46.7)

## 2019-06-20 LAB — HGB A1C W/O EAG: Hgb A1c MFr Bld: 5.4 % (ref 4.8–5.6)

## 2019-06-20 LAB — ANA W/REFLEX: Anti Nuclear Antibody (ANA): NEGATIVE

## 2019-06-20 LAB — TSH: TSH: 0.849 u[IU]/mL (ref 0.450–4.500)

## 2019-06-20 LAB — VITAMIN B1: Thiamine: 84.2 nmol/L (ref 66.5–200.0)

## 2019-06-20 LAB — SEDIMENTATION RATE: Sed Rate: 13 mm/hr (ref 0–30)

## 2019-06-20 LAB — RHEUMATOID FACTOR: Rheumatoid fact SerPl-aCnc: <10 IU/mL (ref 0.0–13.9)

## 2019-06-20 LAB — RPR: RPR Ser Ql: NONREACTIVE

## 2019-06-20 LAB — B12 AND FOLATE PANEL
Folate: >20 ng/mL (ref >3.0)
Vitamin B-12: 1733 pg/mL — ABNORMAL HIGH (ref 232–1245)

## 2019-06-20 NOTE — Progress Notes (Signed)
  Labs do not show any obvious or treatable cause for neuropathy or nerve damage, his B6 vitamin and vitamin B12 were both elevated, likely because of taking supplementation for these B vitamins.  Please update patient, I would advise him not to take any additional vitamin B6 at this time.

## 2019-06-21 ENCOUNTER — Telehealth: Payer: Self-pay

## 2019-06-21 NOTE — Telephone Encounter (Signed)
-----   Message from Star Age, MD sent at 06/20/2019  5:00 PM EST -----  Labs do not show any obvious or treatable cause for neuropathy or nerve damage, his B6 vitamin and vitamin B12 were both elevated, likely because of taking supplementation for these B vitamins.  Please update patient, I would advise him not to take any additional vitamin B6 at this time.

## 2019-06-21 NOTE — Telephone Encounter (Signed)
I attempted to reach the pt to discuss lab results. Pt was not available at the time of call and vm was not working. Will try again at a later time.

## 2019-06-21 NOTE — Progress Notes (Signed)
06/21/19 pt not available vm not working.

## 2019-06-22 NOTE — Telephone Encounter (Signed)
Patient returned missed call. Please follow up  Patient would also like to know why he is scheduled for a NCV or EMG.

## 2019-06-22 NOTE — Telephone Encounter (Signed)
I reached out to the James Whitehead and we were able to discuss his labs. James Whitehead verbalized understanding but wanted to confirm why the NCS//EMG was scheduled. I advised the James Whitehead Dr. Rexene Alberts had recommended this during his last appt to further investigate the cause of neuropathy.  James Whitehead verbalized understanding and will keep study as scheduled in Feb of 2021.

## 2019-07-27 ENCOUNTER — Encounter: Payer: Managed Care, Other (non HMO) | Admitting: Diagnostic Neuroimaging

## 2020-05-28 IMAGING — DX DG PORTABLE PELVIS
1 series · 1 of 1 positions shown · non-contrast
Comparison: C-arm images obtained earlier today.

CLINICAL DATA: Status post right total hip replacement.

EXAM:
PORTABLE PELVIS 1-2 VIEWS

[pelvis ap]
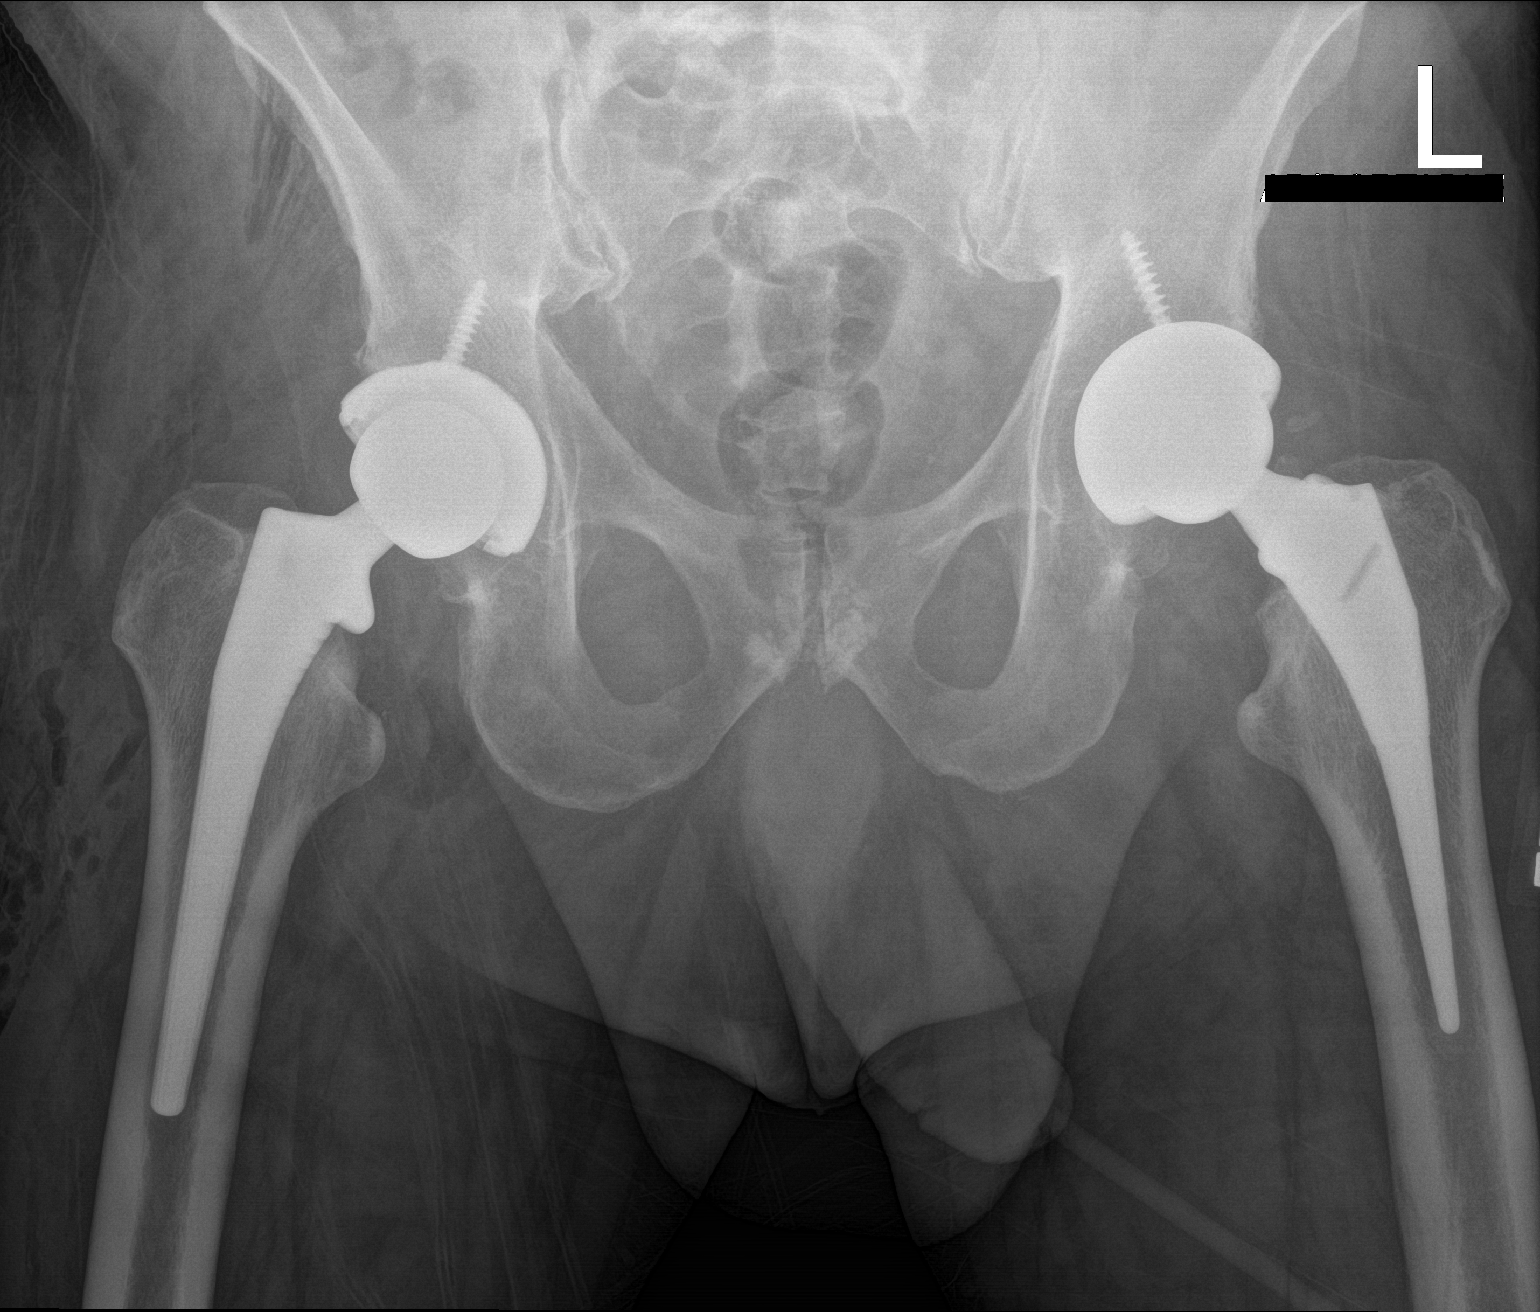

[1 of 1 positions shown; findings below may reference images not displayed]

FINDINGS: Bilateral total hip prostheses in satisfactory position and
alignment. Postoperative soft tissue air on the right. No fracture
or dislocation seen. Prostatic calcifications.
IMPRESSION: Satisfactory postoperative appearance of a right total hip
prosthesis.
